# Patient Record
Sex: Female | Born: 1980 | Race: White | Hispanic: No | Marital: Married | Smoking: Never smoker
Health system: Southern US, Community
[De-identification: ages and names within clinical notes are randomized; demographics above are authoritative.]

## PROBLEM LIST (undated history)

## (undated) ENCOUNTER — Inpatient Hospital Stay (HOSPITAL_COMMUNITY): Payer: Self-pay

## (undated) DIAGNOSIS — O139 Gestational [pregnancy-induced] hypertension without significant proteinuria, unspecified trimester: Secondary | ICD-10-CM

## (undated) DIAGNOSIS — D649 Anemia, unspecified: Secondary | ICD-10-CM

## (undated) DIAGNOSIS — R87619 Unspecified abnormal cytological findings in specimens from cervix uteri: Secondary | ICD-10-CM

## (undated) DIAGNOSIS — IMO0002 Reserved for concepts with insufficient information to code with codable children: Secondary | ICD-10-CM

## (undated) HISTORY — DX: Anemia, unspecified: D64.9

## (undated) HISTORY — DX: Unspecified abnormal cytological findings in specimens from cervix uteri: R87.619

## (undated) HISTORY — DX: Reserved for concepts with insufficient information to code with codable children: IMO0002

## (undated) HISTORY — PX: LEEP: SHX91

---

## 2010-04-13 ENCOUNTER — Inpatient Hospital Stay (HOSPITAL_COMMUNITY): Admission: AD | Admit: 2010-04-13 | Discharge: 2010-04-13 | Payer: Self-pay | Admitting: Obstetrics and Gynecology

## 2010-04-18 ENCOUNTER — Inpatient Hospital Stay (HOSPITAL_COMMUNITY): Admission: AD | Admit: 2010-04-18 | Discharge: 2010-04-20 | Payer: Self-pay | Admitting: Obstetrics and Gynecology

## 2010-04-19 ENCOUNTER — Encounter (INDEPENDENT_AMBULATORY_CARE_PROVIDER_SITE_OTHER): Payer: Self-pay | Admitting: Obstetrics and Gynecology

## 2010-08-21 LAB — LACTATE DEHYDROGENASE
LDH: 150 U/L (ref 94–250)
LDH: 158 U/L (ref 94–250)

## 2010-08-21 LAB — CBC
HCT: 31.9 % — ABNORMAL LOW (ref 36.0–46.0)
HCT: 32 % — ABNORMAL LOW (ref 36.0–46.0)
Hemoglobin: 10.9 g/dL — ABNORMAL LOW (ref 12.0–15.0)
MCH: 29.9 pg (ref 26.0–34.0)
MCH: 29.9 pg (ref 26.0–34.0)
MCH: 30 pg (ref 26.0–34.0)
MCHC: 33.8 g/dL (ref 30.0–36.0)
MCHC: 33.9 g/dL (ref 30.0–36.0)
MCHC: 34.1 g/dL (ref 30.0–36.0)
MCV: 88.1 fL (ref 78.0–100.0)
MCV: 88.8 fL (ref 78.0–100.0)
Platelets: 215 10*3/uL (ref 150–400)
Platelets: 281 10*3/uL (ref 150–400)
Platelets: 282 10*3/uL (ref 150–400)
Platelets: 293 10*3/uL (ref 150–400)
RBC: 2.58 MIL/uL — ABNORMAL LOW (ref 3.87–5.11)
RBC: 3.64 MIL/uL — ABNORMAL LOW (ref 3.87–5.11)
RDW: 12.1 % (ref 11.5–15.5)
RDW: 12.2 % (ref 11.5–15.5)
RDW: 12.7 % (ref 11.5–15.5)
WBC: 10.5 10*3/uL (ref 4.0–10.5)
WBC: 8.8 10*3/uL (ref 4.0–10.5)

## 2010-08-21 LAB — COMPREHENSIVE METABOLIC PANEL
ALT: 25 U/L (ref 0–35)
AST: 32 U/L (ref 0–37)
AST: 39 U/L — ABNORMAL HIGH (ref 0–37)
Albumin: 2.3 g/dL — ABNORMAL LOW (ref 3.5–5.2)
Albumin: 2.8 g/dL — ABNORMAL LOW (ref 3.5–5.2)
Alkaline Phosphatase: 193 U/L — ABNORMAL HIGH (ref 39–117)
BUN: 8 mg/dL (ref 6–23)
CO2: 20 mEq/L (ref 19–32)
Calcium: 9 mg/dL (ref 8.4–10.5)
Chloride: 106 mEq/L (ref 96–112)
Chloride: 106 mEq/L (ref 96–112)
Creatinine, Ser: 0.61 mg/dL (ref 0.4–1.2)
Creatinine, Ser: 0.68 mg/dL (ref 0.4–1.2)
GFR calc Af Amer: >60 mL/min (ref >60)
GFR calc Af Amer: >60 mL/min (ref >60)
GFR calc non Af Amer: >60 mL/min (ref >60)
Glucose, Bld: 68 mg/dL — ABNORMAL LOW (ref 70–99)
Glucose, Bld: 74 mg/dL (ref 70–99)
Potassium: 3.7 mEq/L (ref 3.5–5.1)
Sodium: 137 mEq/L (ref 135–145)
Sodium: 137 mEq/L (ref 135–145)
Total Bilirubin: 0.2 mg/dL — ABNORMAL LOW (ref 0.3–1.2)
Total Bilirubin: 0.3 mg/dL (ref 0.3–1.2)
Total Protein: 6.9 g/dL (ref 6.0–8.3)

## 2010-08-21 LAB — URINALYSIS, ROUTINE W REFLEX MICROSCOPIC
Bilirubin Urine: NEGATIVE
Ketones, ur: NEGATIVE mg/dL
Nitrite: NEGATIVE
Protein, ur: NEGATIVE mg/dL
pH: 6.5 (ref 5.0–8.0)

## 2010-08-21 LAB — URINALYSIS, DIPSTICK ONLY
Glucose, UA: NEGATIVE mg/dL
Ketones, ur: >80 mg/dL — AB
Nitrite: NEGATIVE
Specific Gravity, Urine: 1.025 (ref 1.005–1.030)
pH: 6.5 (ref 5.0–8.0)

## 2010-08-21 LAB — URINE MICROSCOPIC-ADD ON

## 2010-08-21 LAB — RPR: RPR Ser Ql: NONREACTIVE

## 2010-08-21 LAB — PROTEIN / CREATININE RATIO, URINE
Creatinine, Urine: 77.2 mg/dL
Protein Creatinine Ratio: 0.45 — ABNORMAL HIGH (ref 0.00–0.15)

## 2010-08-21 LAB — URIC ACID: Uric Acid, Serum: 6.6 mg/dL (ref 2.4–7.0)

## 2010-10-30 ENCOUNTER — Other Ambulatory Visit: Payer: Self-pay | Admitting: Obstetrics and Gynecology

## 2012-06-10 NOTE — L&D Delivery Note (Signed)
Patient was C/C/+2 and pushed for 5 minutes with epidural, and delivered moments prior to my arrival.   NSVD female infant, Apgars 9/9, weight pending.   The patient had a 1st lacerations repaired with 3-0 vicryl. Fundus was firm. EBL was expected. Placenta was delivered intact. Vagina was clear.  Baby was vigorous to bedside.  Philip Aspen

## 2012-06-26 LAB — OB RESULTS CONSOLE ABO/RH: RH Type: POSITIVE

## 2012-06-26 LAB — OB RESULTS CONSOLE ANTIBODY SCREEN: Antibody Screen: NEGATIVE

## 2012-06-26 LAB — OB RESULTS CONSOLE RPR: RPR: NONREACTIVE

## 2012-06-26 LAB — OB RESULTS CONSOLE HEPATITIS B SURFACE ANTIGEN: Hepatitis B Surface Ag: NEGATIVE

## 2012-12-22 ENCOUNTER — Inpatient Hospital Stay (HOSPITAL_COMMUNITY)
Admission: AD | Admit: 2012-12-22 | Discharge: 2012-12-22 | Disposition: A | Discharge status: Home / Self Care | Payer: BC Managed Care – PPO | Source: Ambulatory Visit | Attending: Obstetrics and Gynecology | Admitting: Obstetrics and Gynecology

## 2012-12-22 ENCOUNTER — Encounter (HOSPITAL_COMMUNITY): Payer: Self-pay

## 2012-12-22 DIAGNOSIS — O4700 False labor before 37 completed weeks of gestation, unspecified trimester: Secondary | ICD-10-CM | POA: Insufficient documentation

## 2012-12-22 DIAGNOSIS — O139 Gestational [pregnancy-induced] hypertension without significant proteinuria, unspecified trimester: Secondary | ICD-10-CM | POA: Insufficient documentation

## 2012-12-22 HISTORY — DX: Gestational (pregnancy-induced) hypertension without significant proteinuria, unspecified trimester: O13.9

## 2012-12-22 LAB — URINALYSIS, ROUTINE W REFLEX MICROSCOPIC
Ketones, ur: NEGATIVE mg/dL
Nitrite: NEGATIVE
Protein, ur: NEGATIVE mg/dL

## 2012-12-22 LAB — CBC
HCT: 28.7 % — ABNORMAL LOW (ref 36.0–46.0)
MCV: 84.2 fL (ref 78.0–100.0)
RBC: 3.41 MIL/uL — ABNORMAL LOW (ref 3.87–5.11)
RDW: 13.4 % (ref 11.5–15.5)
WBC: 8.6 10*3/uL (ref 4.0–10.5)

## 2012-12-22 LAB — COMPREHENSIVE METABOLIC PANEL
BUN: 6 mg/dL (ref 6–23)
CO2: 22 mEq/L (ref 19–32)
Chloride: 102 mEq/L (ref 96–112)
Creatinine, Ser: 0.6 mg/dL (ref 0.50–1.10)
GFR calc Af Amer: >90 mL/min (ref >90)
GFR calc non Af Amer: >90 mL/min (ref >90)
Total Bilirubin: 0.2 mg/dL — ABNORMAL LOW (ref 0.3–1.2)

## 2012-12-22 LAB — URIC ACID: Uric Acid, Serum: 4.9 mg/dL (ref 2.4–7.0)

## 2012-12-22 NOTE — MAU Note (Signed)
Patient states she was sent from the office after a regular visit to evaluate for elevated blood pressure. Patient states she has started to have a headache, has spots before eyes and swelling in the feet and lower legs. Nausea, no vomiting. Denies contractions but does have occasional sharp abdominal pain, no bleeding or leaking and reports good fetal movement.

## 2012-12-22 NOTE — MAU Note (Signed)
Pt states prior hx of pre-eclampsia, bp 140/90's in office. Has had intermittent spots before eyes, none present now. Denies dizziness or blurred vision, also no ruq pain.

## 2012-12-22 NOTE — MAU Provider Note (Signed)
S: 32 y.o. [id number] @[redacted]w[redacted]d  presents to MAU from office for evaluation of elevated BP.  Pt reports intermittent h/a in last 2 weeks, rates current h/a 2/10 on pain scale.  She also reports seeing visual spots intermittently, but denies this symptom currently. She has hx of preeclampsia in previous pregnancy with IOL and late preterm delivery for this.  She reports good fetal movement, denies LOF, vaginal bleeding, vaginal itching/burning, urinary symptoms, dizziness, n/v, or fever/chills.    O: BP 127/86  Pulse 97  Temp(Src) 98.1 F (36.7 C) (Oral)  Resp 16  Ht 5\' 3"  (1.6 m)  Wt 84.188 kg (185 lb 9.6 oz)  BMI 32.89 kg/m2  SpO2 99%  Results for orders placed during the hospital encounter of 12/22/12 (from the past 24 hour(s))  URINALYSIS, ROUTINE W REFLEX MICROSCOPIC     Status: Abnormal   Collection Time    12/22/12 10:40 AM      Result Value Range   Color, Urine YELLOW  YELLOW   APPearance CLEAR  CLEAR   Specific Gravity, Urine 1.020  1.005 - 1.030   pH 6.5  5.0 - 8.0   Glucose, UA NEGATIVE  NEGATIVE mg/dL   Hgb urine dipstick NEGATIVE  NEGATIVE   Bilirubin Urine NEGATIVE  NEGATIVE   Ketones, ur NEGATIVE  NEGATIVE mg/dL   Protein, ur NEGATIVE  NEGATIVE mg/dL   Urobilinogen, UA 0.2  0.0 - 1.0 mg/dL   Nitrite NEGATIVE  NEGATIVE   Leukocytes, UA TRACE (*) NEGATIVE  URINE MICROSCOPIC-ADD ON     Status: Abnormal   Collection Time    12/22/12 10:40 AM      Result Value Range   Squamous Epithelial / LPF FEW (*) RARE   WBC, UA 0-2  <3 WBC/hpf  CBC     Status: Abnormal   Collection Time    12/22/12 11:25 AM      Result Value Range   WBC 8.6  4.0 - 10.5 K/uL   RBC 3.41 (*) 3.87 - 5.11 MIL/uL   Hemoglobin 9.5 (*) 12.0 - 15.0 g/dL   HCT 45.4 (*) 09.8 - 11.9 %   MCV 84.2  78.0 - 100.0 fL   MCH 27.9  26.0 - 34.0 pg   MCHC 33.1  30.0 - 36.0 g/dL   RDW 14.7  82.9 - 56.2 %   Platelets 322  150 - 400 K/uL  COMPREHENSIVE METABOLIC PANEL     Status: Abnormal   Collection Time   12/22/12 11:25 AM      Result Value Range   Sodium 136  135 - 145 mEq/L   Potassium 3.5  3.5 - 5.1 mEq/L   Chloride 102  96 - 112 mEq/L   CO2 22  19 - 32 mEq/L   Glucose, Bld 94  70 - 99 mg/dL   BUN 6  6 - 23 mg/dL   Creatinine, Ser 1.30  0.50 - 1.10 mg/dL   Calcium 9.0  8.4 - 86.5 mg/dL   Total Protein 6.3  6.0 - 8.3 g/dL   Albumin 2.5 (*) 3.5 - 5.2 g/dL   AST 14  0 - 37 U/L   ALT 8  0 - 35 U/L   Alkaline Phosphatase 107  39 - 117 U/L   Total Bilirubin 0.2 (*) 0.3 - 1.2 mg/dL   GFR calc non Af Amer >90  >90 mL/min   GFR calc Af Amer >90  >90 mL/min  URIC ACID     Status: None   Collection  Time    12/22/12 11:25 AM      Result Value Range   Uric Acid, Serum 4.9  2.4 - 7.0 mg/dL   FHR 478 with moderate variability, accels present, no decels Occasional contractions on toco that palpate mild  A: Gestational HTN vs preeclampsia  P: RN to call Dr Dareen Piano with lab results  Sharen Counter Certified Nurse-Midwife

## 2012-12-29 ENCOUNTER — Inpatient Hospital Stay (HOSPITAL_COMMUNITY)
Admission: AD | Admit: 2012-12-29 | Discharge: 2012-12-29 | Disposition: A | Discharge status: Home / Self Care | Payer: BC Managed Care – PPO | Source: Ambulatory Visit | Attending: Obstetrics & Gynecology | Admitting: Obstetrics & Gynecology

## 2012-12-29 ENCOUNTER — Encounter (HOSPITAL_COMMUNITY): Payer: Self-pay | Admitting: *Deleted

## 2012-12-29 DIAGNOSIS — O139 Gestational [pregnancy-induced] hypertension without significant proteinuria, unspecified trimester: Secondary | ICD-10-CM | POA: Insufficient documentation

## 2012-12-29 LAB — COMPREHENSIVE METABOLIC PANEL
AST: 13 U/L (ref 0–37)
BUN: 7 mg/dL (ref 6–23)
CO2: 21 mEq/L (ref 19–32)
Calcium: 9 mg/dL (ref 8.4–10.5)
Creatinine, Ser: 0.62 mg/dL (ref 0.50–1.10)
GFR calc Af Amer: >90 mL/min (ref >90)
GFR calc non Af Amer: >90 mL/min (ref >90)
Glucose, Bld: 96 mg/dL (ref 70–99)

## 2012-12-29 LAB — URIC ACID: Uric Acid, Serum: 5.1 mg/dL (ref 2.4–7.0)

## 2012-12-29 LAB — URINE MICROSCOPIC-ADD ON

## 2012-12-29 LAB — URINALYSIS, ROUTINE W REFLEX MICROSCOPIC
Nitrite: NEGATIVE
Protein, ur: NEGATIVE mg/dL
Urobilinogen, UA: 0.2 mg/dL (ref 0.0–1.0)

## 2012-12-29 LAB — CBC
MCH: 27.5 pg (ref 26.0–34.0)
MCV: 83.7 fL (ref 78.0–100.0)
Platelets: 309 10*3/uL (ref 150–400)
RBC: 3.49 MIL/uL — ABNORMAL LOW (ref 3.87–5.11)

## 2012-12-29 NOTE — Progress Notes (Signed)
Written and verbal d/c instructions given and understanding voiced. 

## 2012-12-29 NOTE — MAU Note (Signed)
Sent from office for labs and NST due to elevated B/Ps. Hx PIH with first preg.

## 2012-12-29 NOTE — MAU Provider Note (Signed)
S: 32 y.o. [id number] @[redacted]w[redacted]d  presents to MAU from the office for elevated BP.  She reports good fetal movement, and denies h/a, visual disturbances, epigastric pain, cramping/contractions, n/v, or fever/chills.  O: BP 126/88  Pulse 87  Temp(Src) 98.8 F (37.1 C)  Resp 20  Ht 5\' 3"  (1.6 m)  Wt 84.732 kg (186 lb 12.8 oz)  BMI 33.1 kg/m2  SpO2 99%  Patient Vitals for the past 24 hrs:  BP Temp Pulse Resp SpO2 Height Weight  12/29/12 1201 126/88 mmHg - 87 - - - -  12/29/12 1146 131/90 mmHg - 90 - - - -  12/29/12 1131 131/96 mmHg - 91 - - - -  12/29/12 1116 137/96 mmHg - 101 - - - -  12/29/12 1110 - - 102 - - - -  12/29/12 1109 135/94 mmHg - 98 - - - -  12/29/12 1108 135/94 mmHg 98.8 F (37.1 C) 98 20 99 % - -  12/29/12 1059 - - - - - 5\' 3"  (1.6 m) 84.732 kg (186 lb 12.8 oz)   Results for orders placed during the hospital encounter of 12/29/12 (from the past 24 hour(s))  URINALYSIS, ROUTINE W REFLEX MICROSCOPIC     Status: Abnormal   Collection Time    12/29/12 10:55 AM      Result Value Range   Color, Urine YELLOW  YELLOW   APPearance CLEAR  CLEAR   Specific Gravity, Urine 1.015  1.005 - 1.030   pH 6.0  5.0 - 8.0   Glucose, UA NEGATIVE  NEGATIVE mg/dL   Hgb urine dipstick NEGATIVE  NEGATIVE   Bilirubin Urine NEGATIVE  NEGATIVE   Ketones, ur NEGATIVE  NEGATIVE mg/dL   Protein, ur NEGATIVE  NEGATIVE mg/dL   Urobilinogen, UA 0.2  0.0 - 1.0 mg/dL   Nitrite NEGATIVE  NEGATIVE   Leukocytes, UA TRACE (*) NEGATIVE  URINE MICROSCOPIC-ADD ON     Status: None   Collection Time    12/29/12 10:55 AM      Result Value Range   Squamous Epithelial / LPF RARE  RARE   WBC, UA 0-2  <3 WBC/hpf   Bacteria, UA RARE  RARE  CBC     Status: Abnormal   Collection Time    12/29/12 11:15 AM      Result Value Range   WBC 8.6  4.0 - 10.5 K/uL   RBC 3.49 (*) 3.87 - 5.11 MIL/uL   Hemoglobin 9.6 (*) 12.0 - 15.0 g/dL   HCT 45.4 (*) 09.8 - 11.9 %   MCV 83.7  78.0 - 100.0 fL   MCH 27.5  26.0 - 34.0 pg    MCHC 32.9  30.0 - 36.0 g/dL   RDW 14.7  82.9 - 56.2 %   Platelets 309  150 - 400 K/uL  COMPREHENSIVE METABOLIC PANEL     Status: Abnormal   Collection Time    12/29/12 11:15 AM      Result Value Range   Sodium 134 (*) 135 - 145 mEq/L   Potassium 3.6  3.5 - 5.1 mEq/L   Chloride 102  96 - 112 mEq/L   CO2 21  19 - 32 mEq/L   Glucose, Bld 96  70 - 99 mg/dL   BUN 7  6 - 23 mg/dL   Creatinine, Ser 1.30  0.50 - 1.10 mg/dL   Calcium 9.0  8.4 - 86.5 mg/dL   Total Protein 6.5  6.0 - 8.3 g/dL   Albumin 2.6 (*) 3.5 -  5.2 g/dL   AST 13  0 - 37 U/L   ALT 7  0 - 35 U/L   Alkaline Phosphatase 118 (*) 39 - 117 U/L   Total Bilirubin 0.2 (*) 0.3 - 1.2 mg/dL   GFR calc non Af Amer >90  >90 mL/min   GFR calc Af Amer >90  >90 mL/min  URIC ACID     Status: None   Collection Time    12/29/12 11:15 AM      Result Value Range   Uric Acid, Serum 5.1  2.4 - 7.0 mg/dL   A: Gestational HTN  P: Medical screening completed RN to call Dr Tenny Craw with results  Sharen Counter Certified Nurse-Midwife

## 2012-12-29 NOTE — MAU Provider Note (Signed)
Gestational hypertension without severe range BPs. Will have patient return to office Thursday for a BP check and NST.  Consider IOL @ 37 weeks

## 2012-12-29 NOTE — MAU Note (Signed)
Patient was sent from the office for evaluation of elevated blood pressure.

## 2012-12-29 NOTE — Progress Notes (Signed)
Dr Waynard Reeds notified of pt's lab results, assessment, B/Ps, reactive FHR strip. Pt stable for d/c home. Office will call pt for visit in office on Thurs. 7/24. Pt agrees

## 2012-12-31 ENCOUNTER — Inpatient Hospital Stay (EMERGENCY_DEPARTMENT_HOSPITAL)
Admission: AD | Admit: 2012-12-31 | Discharge: 2012-12-31 | Disposition: A | Discharge status: Home / Self Care | Payer: BC Managed Care – PPO | Source: Ambulatory Visit | Attending: Obstetrics and Gynecology | Admitting: Obstetrics and Gynecology

## 2012-12-31 ENCOUNTER — Encounter (HOSPITAL_COMMUNITY): Payer: Self-pay | Admitting: *Deleted

## 2012-12-31 DIAGNOSIS — O139 Gestational [pregnancy-induced] hypertension without significant proteinuria, unspecified trimester: Secondary | ICD-10-CM

## 2012-12-31 DIAGNOSIS — O133 Gestational [pregnancy-induced] hypertension without significant proteinuria, third trimester: Secondary | ICD-10-CM

## 2012-12-31 LAB — OB RESULTS CONSOLE GBS: GBS: POSITIVE

## 2012-12-31 LAB — COMPREHENSIVE METABOLIC PANEL
AST: 14 U/L (ref 0–37)
Albumin: 2.5 g/dL — ABNORMAL LOW (ref 3.5–5.2)
BUN: 8 mg/dL (ref 6–23)
CO2: 20 mEq/L (ref 19–32)
Calcium: 9.1 mg/dL (ref 8.4–10.5)
Creatinine, Ser: 0.62 mg/dL (ref 0.50–1.10)
GFR calc non Af Amer: >90 mL/min (ref >90)

## 2012-12-31 LAB — LACTATE DEHYDROGENASE: LDH: 176 U/L (ref 94–250)

## 2012-12-31 LAB — CBC
HCT: 28.4 % — ABNORMAL LOW (ref 36.0–46.0)
MCH: 27.2 pg (ref 26.0–34.0)
MCV: 83 fL (ref 78.0–100.0)
Platelets: 337 10*3/uL (ref 150–400)
RDW: 13.6 % (ref 11.5–15.5)

## 2012-12-31 LAB — URINALYSIS, ROUTINE W REFLEX MICROSCOPIC
Bilirubin Urine: NEGATIVE
Ketones, ur: NEGATIVE mg/dL
Nitrite: NEGATIVE
Urobilinogen, UA: 0.2 mg/dL (ref 0.0–1.0)
pH: 6.5 (ref 5.0–8.0)

## 2012-12-31 LAB — URIC ACID: Uric Acid, Serum: 4.7 mg/dL (ref 2.4–7.0)

## 2012-12-31 NOTE — MAU Provider Note (Signed)
agree

## 2012-12-31 NOTE — MAU Note (Signed)
Pt sent from office for PIH eval 

## 2012-12-31 NOTE — MAU Provider Note (Addendum)
Chief Complaint:  Hypertension   None     HPI: Maureen Melton is a 32 y.o. G2P0101 at 84w1dwho presents to maternity admissions with elevated BP in the office. She was seen in MAU 2 days ago with the same.  She also has an increase in swelling of her feet and ankles and a mild h/a starting this morning rated 2/10.  She has not taken anything for pain.  She denies visual disturbances or epigastric pain.  She reports good fetal movement, denies LOF, vaginal bleeding, vaginal itching/burning, urinary symptoms, dizziness, n/v, or fever/chills.     Past Medical History: Past Medical History  Diagnosis Date  . Pregnancy induced hypertension   . Preterm labor     Past obstetric history: OB History   Grav Para Term Preterm Abortions TAB SAB Ect Mult Living   2 1  1      1      # Outc Date GA Lbr Len/2nd Wgt Sex Del Anes PTL Lv   1 PRE            2 CUR               Past Surgical History: Past Surgical History  Procedure Laterality Date  . Leep      Family History: Family History  Problem Relation Age of Onset  . Diabetes Paternal Grandmother   . Diabetes Paternal Grandfather     Social History: History  Substance Use Topics  . Smoking status: Never Smoker   . Smokeless tobacco: Never Used  . Alcohol Use: No    Allergies:  Allergies  Allergen Reactions  . Penicillins Rash  . Zithromax (Azithromycin) Rash    Meds:  Prescriptions prior to admission  Medication Sig Dispense Refill  . acetaminophen (TYLENOL) 325 MG tablet Take 650 mg by mouth every 6 (six) hours as needed for pain.      . Prenatal Vit-Fe Fumarate-FA (PRENATAL MULTIVITAMIN) TABS Take 1 tablet by mouth daily.         ROS: Pertinent findings in history of present illness.  Physical Exam  Blood pressure 129/82, pulse 90, temperature 98.3 F (36.8 C), temperature source Oral, resp. rate 18, height 5\' 3"  (1.6 m), weight 85.276 kg (188 lb). GENERAL: Well-developed, well-nourished female in no acute  distress.  HEENT: normocephalic HEART: normal rate RESP: normal effort ABDOMEN: Soft, non-tender, gravid appropriate for gestational age EXTREMITIES: Nontender, no edema NEURO: alert and oriented   Results for orders placed during the hospital encounter of 12/31/12 (from the past 24 hour(s))  URINALYSIS, ROUTINE W REFLEX MICROSCOPIC     Status: Abnormal   Collection Time    12/31/12 11:00 AM      Result Value Range   Color, Urine YELLOW  YELLOW   APPearance CLEAR  CLEAR   Specific Gravity, Urine 1.020  1.005 - 1.030   pH 6.5  5.0 - 8.0   Glucose, UA NEGATIVE  NEGATIVE mg/dL   Hgb urine dipstick NEGATIVE  NEGATIVE   Bilirubin Urine NEGATIVE  NEGATIVE   Ketones, ur NEGATIVE  NEGATIVE mg/dL   Protein, ur NEGATIVE  NEGATIVE mg/dL   Urobilinogen, UA 0.2  0.0 - 1.0 mg/dL   Nitrite NEGATIVE  NEGATIVE   Leukocytes, UA SMALL (*) NEGATIVE  URINE MICROSCOPIC-ADD ON     Status: Abnormal   Collection Time    12/31/12 11:00 AM      Result Value Range   Squamous Epithelial / LPF FEW (*) RARE   WBC,  UA 3-6  <3 WBC/hpf  CBC     Status: Abnormal   Collection Time    12/31/12 12:24 PM      Result Value Range   WBC 9.3  4.0 - 10.5 K/uL   RBC 3.42 (*) 3.87 - 5.11 MIL/uL   Hemoglobin 9.3 (*) 12.0 - 15.0 g/dL   HCT 16.1 (*) 09.6 - 04.5 %   MCV 83.0  78.0 - 100.0 fL   MCH 27.2  26.0 - 34.0 pg   MCHC 32.7  30.0 - 36.0 g/dL   RDW 40.9  81.1 - 91.4 %   Platelets 337  150 - 400 K/uL  COMPREHENSIVE METABOLIC PANEL     Status: Abnormal   Collection Time    12/31/12 12:24 PM      Result Value Range   Sodium 134 (*) 135 - 145 mEq/L   Potassium 4.0  3.5 - 5.1 mEq/L   Chloride 102  96 - 112 mEq/L   CO2 20  19 - 32 mEq/L   Glucose, Bld 80  70 - 99 mg/dL   BUN 8  6 - 23 mg/dL   Creatinine, Ser 7.82  0.50 - 1.10 mg/dL   Calcium 9.1  8.4 - 95.6 mg/dL   Total Protein 6.2  6.0 - 8.3 g/dL   Albumin 2.5 (*) 3.5 - 5.2 g/dL   AST 14  0 - 37 U/L   ALT 7  0 - 35 U/L   Alkaline Phosphatase 115  39 - 117  U/L   Total Bilirubin 0.2 (*) 0.3 - 1.2 mg/dL   GFR calc non Af Amer >90  >90 mL/min   GFR calc Af Amer >90  >90 mL/min  URIC ACID     Status: None   Collection Time    12/31/12 12:24 PM      Result Value Range   Uric Acid, Serum 4.7  2.4 - 7.0 mg/dL  LACTATE DEHYDROGENASE     Status: None   Collection Time    12/31/12 12:24 PM      Result Value Range   LDH 176  94 - 250 U/L    FHT:  Baseline 135 , moderate variability, accelerations present, no decelerations Contractions: occasional   PIH labs pending Tylenol 650 mg PO in MAU  Report to Joseph Berkshire, PA   Sharen Counter Certified Nurse-Midwife 12/31/2012 12:14 PM  Current Blood pressure 129/82, 127/80 FMS reactive baseline 125 rare-occ contraction Labs, current BPs, and FMS reading reported to Dr. Claiborne Billings.  May discharge patient to home.  Tell patient the office will be calling her because she now meets criteria for gestational hypertension and the office will call to set up induction. Patient states her headache has resolved.  A:  Gestational hypertension  P:  PIH precautions given as well as instructions that office will call her     Report any headache, visual changes, chest pain, vaginal bleeding/loss of fluid, decreased fetal movement to MD

## 2013-01-01 ENCOUNTER — Encounter (HOSPITAL_COMMUNITY): Payer: Self-pay | Admitting: *Deleted

## 2013-01-01 ENCOUNTER — Telehealth (HOSPITAL_COMMUNITY): Payer: Self-pay | Admitting: *Deleted

## 2013-01-01 NOTE — Telephone Encounter (Signed)
Preadmission screen  

## 2013-01-01 NOTE — MAU Provider Note (Signed)
agree

## 2013-01-05 ENCOUNTER — Inpatient Hospital Stay (HOSPITAL_COMMUNITY)
Admission: RE | Admit: 2013-01-05 | Discharge: 2013-01-08 | DRG: 372 | Disposition: A | Discharge status: Home / Self Care | Payer: BC Managed Care – PPO | Source: Ambulatory Visit | Attending: Obstetrics and Gynecology | Admitting: Obstetrics and Gynecology

## 2013-01-05 ENCOUNTER — Encounter (HOSPITAL_COMMUNITY): Payer: Self-pay

## 2013-01-05 DIAGNOSIS — O99892 Other specified diseases and conditions complicating childbirth: Secondary | ICD-10-CM | POA: Diagnosis present

## 2013-01-05 DIAGNOSIS — O139 Gestational [pregnancy-induced] hypertension without significant proteinuria, unspecified trimester: Secondary | ICD-10-CM | POA: Diagnosis present

## 2013-01-05 DIAGNOSIS — Z22330 Carrier of Group B streptococcus: Secondary | ICD-10-CM

## 2013-01-05 DIAGNOSIS — Z880 Allergy status to penicillin: Secondary | ICD-10-CM

## 2013-01-05 LAB — COMPREHENSIVE METABOLIC PANEL
BUN: 9 mg/dL (ref 6–23)
CO2: 22 mEq/L (ref 19–32)
Calcium: 9.4 mg/dL (ref 8.4–10.5)
Chloride: 102 mEq/L (ref 96–112)
Creatinine, Ser: 0.51 mg/dL (ref 0.50–1.10)
GFR calc Af Amer: >90 mL/min (ref >90)
GFR calc non Af Amer: >90 mL/min (ref >90)
Glucose, Bld: 102 mg/dL — ABNORMAL HIGH (ref 70–99)
Total Bilirubin: 0.2 mg/dL — ABNORMAL LOW (ref 0.3–1.2)

## 2013-01-05 LAB — CBC
MCH: 26.6 pg (ref 26.0–34.0)
Platelets: 358 10*3/uL (ref 150–400)
RBC: 3.64 MIL/uL — ABNORMAL LOW (ref 3.87–5.11)
RDW: 13.6 % (ref 11.5–15.5)

## 2013-01-05 LAB — URIC ACID: Uric Acid, Serum: 4.7 mg/dL (ref 2.4–7.0)

## 2013-01-05 LAB — TYPE AND SCREEN: ABO/RH(D): O POS

## 2013-01-05 LAB — ABO/RH: ABO/RH(D): O POS

## 2013-01-05 LAB — LACTATE DEHYDROGENASE: LDH: 187 U/L (ref 94–250)

## 2013-01-05 MED ORDER — CITRIC ACID-SODIUM CITRATE 334-500 MG/5ML PO SOLN: 30.0000 mL | ORAL | Status: DC | PRN

## 2013-01-05 MED ORDER — TERBUTALINE SULFATE 1 MG/ML IJ SOLN: 0.2500 mg | Freq: Once | INTRAMUSCULAR | Status: AC | PRN

## 2013-01-05 MED ORDER — IBUPROFEN 600 MG PO TABS: 600.0000 mg | ORAL_TABLET | Freq: Four times a day (QID) | ORAL | Status: DC | PRN

## 2013-01-05 MED ORDER — OXYCODONE-ACETAMINOPHEN 5-325 MG PO TABS: 1.0000 | ORAL_TABLET | ORAL | Status: DC | PRN

## 2013-01-05 MED ORDER — LACTATED RINGERS IV SOLN: 500.0000 mL | INTRAVENOUS | Status: DC | PRN

## 2013-01-05 MED ORDER — LACTATED RINGERS IV SOLN
INTRAVENOUS | Status: DC
  Administered 2013-01-05 – 2013-01-06 (×2): via INTRAVENOUS

## 2013-01-05 MED ORDER — CEFAZOLIN SODIUM-DEXTROSE 2-3 GM-% IV SOLR
2.0000 g | Freq: Once | INTRAVENOUS | Status: AC
  Administered 2013-01-05: 2 g via INTRAVENOUS
  Filled 2013-01-05: qty 50

## 2013-01-05 MED ORDER — ONDANSETRON HCL 4 MG/2ML IJ SOLN: 4.0000 mg | Freq: Four times a day (QID) | INTRAMUSCULAR | Status: DC | PRN

## 2013-01-05 MED ORDER — OXYTOCIN 40 UNITS IN LACTATED RINGERS INFUSION - SIMPLE MED
1.0000 m[IU]/min | INTRAVENOUS | Status: DC
  Administered 2013-01-05: 1 m[IU]/min via INTRAVENOUS
  Filled 2013-01-05: qty 1000

## 2013-01-05 MED ORDER — LIDOCAINE HCL (PF) 1 % IJ SOLN
30.0000 mL | INTRAMUSCULAR | Status: DC | PRN
  Filled 2013-01-05 (×3): qty 30

## 2013-01-05 MED ORDER — OXYTOCIN BOLUS FROM INFUSION
500.0000 mL | INTRAVENOUS | Status: DC
  Administered 2013-01-06: 500 mL via INTRAVENOUS

## 2013-01-05 MED ORDER — ACETAMINOPHEN 325 MG PO TABS: 650.0000 mg | ORAL_TABLET | ORAL | Status: DC | PRN

## 2013-01-05 MED ORDER — BUTORPHANOL TARTRATE 1 MG/ML IJ SOLN: 1.0000 mg | INTRAMUSCULAR | Status: DC | PRN

## 2013-01-05 MED ORDER — ZOLPIDEM TARTRATE 5 MG PO TABS: 5.0000 mg | ORAL_TABLET | Freq: Every evening | ORAL | Status: DC | PRN

## 2013-01-05 MED ORDER — CEFAZOLIN SODIUM 1-5 GM-% IV SOLN
1.0000 g | Freq: Three times a day (TID) | INTRAVENOUS | Status: DC
  Administered 2013-01-06: 1 g via INTRAVENOUS
  Filled 2013-01-05 (×2): qty 50

## 2013-01-05 MED ORDER — OXYTOCIN 40 UNITS IN LACTATED RINGERS INFUSION - SIMPLE MED: 62.5000 mL/h | INTRAVENOUS | Status: DC

## 2013-01-06 ENCOUNTER — Inpatient Hospital Stay (HOSPITAL_COMMUNITY): Payer: BC Managed Care – PPO | Admitting: Anesthesiology

## 2013-01-06 ENCOUNTER — Encounter (HOSPITAL_COMMUNITY): Payer: Self-pay

## 2013-01-06 ENCOUNTER — Encounter (HOSPITAL_COMMUNITY): Payer: Self-pay | Admitting: Anesthesiology

## 2013-01-06 LAB — CBC
HCT: 30.1 % — ABNORMAL LOW (ref 36.0–46.0)
Hemoglobin: 9.6 g/dL — ABNORMAL LOW (ref 12.0–15.0)
MCH: 26.7 pg (ref 26.0–34.0)
MCV: 83.6 fL (ref 78.0–100.0)
Platelets: 299 10*3/uL (ref 150–400)
RBC: 3.6 MIL/uL — ABNORMAL LOW (ref 3.87–5.11)
WBC: 11.9 10*3/uL — ABNORMAL HIGH (ref 4.0–10.5)

## 2013-01-06 LAB — RPR: RPR Ser Ql: NONREACTIVE

## 2013-01-06 MED ORDER — ZOLPIDEM TARTRATE 5 MG PO TABS: 5.0000 mg | ORAL_TABLET | Freq: Every evening | ORAL | Status: DC | PRN

## 2013-01-06 MED ORDER — FENTANYL 2.5 MCG/ML BUPIVACAINE 1/10 % EPIDURAL INFUSION (WH - ANES)
14.0000 mL/h | INTRAMUSCULAR | Status: DC | PRN
  Administered 2013-01-06: 14 mL/h via EPIDURAL
  Filled 2013-01-06: qty 125

## 2013-01-06 MED ORDER — SIMETHICONE 80 MG PO CHEW: 80.0000 mg | CHEWABLE_TABLET | ORAL | Status: DC | PRN

## 2013-01-06 MED ORDER — OXYCODONE-ACETAMINOPHEN 5-325 MG PO TABS: 1.0000 | ORAL_TABLET | ORAL | Status: DC | PRN

## 2013-01-06 MED ORDER — BENZOCAINE-MENTHOL 20-0.5 % EX AERO
1.0000 "application " | INHALATION_SPRAY | CUTANEOUS | Status: DC | PRN
  Administered 2013-01-06: 1 via TOPICAL
  Filled 2013-01-06: qty 56

## 2013-01-06 MED ORDER — TETANUS-DIPHTH-ACELL PERTUSSIS 5-2.5-18.5 LF-MCG/0.5 IM SUSP
0.5000 mL | Freq: Once | INTRAMUSCULAR | Status: AC
  Administered 2013-01-07: 0.5 mL via INTRAMUSCULAR
  Filled 2013-01-06 (×2): qty 0.5

## 2013-01-06 MED ORDER — OXYTOCIN 40 UNITS IN LACTATED RINGERS INFUSION - SIMPLE MED: 1.0000 m[IU]/min | INTRAVENOUS | Status: DC

## 2013-01-06 MED ORDER — LIDOCAINE HCL (PF) 1 % IJ SOLN
INTRAMUSCULAR | Status: DC | PRN
  Administered 2013-01-06 (×2): 5 mL

## 2013-01-06 MED ORDER — LACTATED RINGERS IV SOLN
500.0000 mL | Freq: Once | INTRAVENOUS | Status: AC
Start: 2013-01-06 — End: 2013-01-06
  Administered 2013-01-06: 500 mL via INTRAVENOUS

## 2013-01-06 MED ORDER — ONDANSETRON HCL 4 MG PO TABS: 4.0000 mg | ORAL_TABLET | ORAL | Status: DC | PRN

## 2013-01-06 MED ORDER — DIPHENHYDRAMINE HCL 25 MG PO CAPS: 25.0000 mg | ORAL_CAPSULE | Freq: Four times a day (QID) | ORAL | Status: DC | PRN

## 2013-01-06 MED ORDER — EPHEDRINE 5 MG/ML INJ
10.0000 mg | INTRAVENOUS | Status: DC | PRN
  Filled 2013-01-06: qty 2

## 2013-01-06 MED ORDER — PHENYLEPHRINE 40 MCG/ML (10ML) SYRINGE FOR IV PUSH (FOR BLOOD PRESSURE SUPPORT)
80.0000 ug | PREFILLED_SYRINGE | INTRAVENOUS | Status: DC | PRN
  Filled 2013-01-06: qty 5
  Filled 2013-01-06: qty 2

## 2013-01-06 MED ORDER — PHENYLEPHRINE 40 MCG/ML (10ML) SYRINGE FOR IV PUSH (FOR BLOOD PRESSURE SUPPORT)
80.0000 ug | PREFILLED_SYRINGE | INTRAVENOUS | Status: DC | PRN
  Filled 2013-01-06: qty 2

## 2013-01-06 MED ORDER — ONDANSETRON HCL 4 MG/2ML IJ SOLN: 4.0000 mg | INTRAMUSCULAR | Status: DC | PRN

## 2013-01-06 MED ORDER — WITCH HAZEL-GLYCERIN EX PADS: 1.0000 "application " | MEDICATED_PAD | CUTANEOUS | Status: DC | PRN

## 2013-01-06 MED ORDER — DIPHENHYDRAMINE HCL 50 MG/ML IJ SOLN: 12.5000 mg | INTRAMUSCULAR | Status: DC | PRN

## 2013-01-06 MED ORDER — PRENATAL MULTIVITAMIN CH
1.0000 | ORAL_TABLET | Freq: Every day | ORAL | Status: DC
  Administered 2013-01-06 – 2013-01-07 (×2): 1 via ORAL
  Filled 2013-01-06 (×2): qty 1

## 2013-01-06 MED ORDER — IBUPROFEN 600 MG PO TABS
600.0000 mg | ORAL_TABLET | Freq: Four times a day (QID) | ORAL | Status: DC
  Administered 2013-01-06 – 2013-01-08 (×7): 600 mg via ORAL
  Filled 2013-01-06 (×7): qty 1

## 2013-01-06 MED ORDER — EPHEDRINE 5 MG/ML INJ
10.0000 mg | INTRAVENOUS | Status: DC | PRN
  Filled 2013-01-06: qty 4
  Filled 2013-01-06: qty 2

## 2013-01-06 MED ORDER — SENNOSIDES-DOCUSATE SODIUM 8.6-50 MG PO TABS
2.0000 | ORAL_TABLET | Freq: Every day | ORAL | Status: DC
  Administered 2013-01-06: 2 via ORAL

## 2013-01-06 MED ORDER — LANOLIN HYDROUS EX OINT: TOPICAL_OINTMENT | CUTANEOUS | Status: DC | PRN

## 2013-01-06 MED ORDER — DIBUCAINE 1 % RE OINT: 1.0000 "application " | TOPICAL_OINTMENT | RECTAL | Status: DC | PRN

## 2013-01-06 NOTE — Anesthesia Postprocedure Evaluation (Signed)
  Anesthesia Post-op Note  Patient: Maureen Melton  Procedure(s) Performed: * No procedures listed *  Patient Location: Mother/Baby  Anesthesia Type:Epidural  Level of Consciousness: awake, alert  and oriented  Airway and Oxygen Therapy: Patient Spontanous Breathing  Post-op Pain: none  Post-op Assessment: Post-op Vital signs reviewed and Patient's Cardiovascular Status Stable  Post-op Vital Signs: Reviewed and stable  Complications: No apparent anesthesia complications

## 2013-01-06 NOTE — Anesthesia Procedure Notes (Signed)
Epidural Patient location during procedure: OB Start time: 01/06/2013 2:07 AM  Staffing Anesthesiologist: Angus Seller., Harrell Gave. Performed by: anesthesiologist   Preanesthetic Checklist Completed: patient identified, site marked, surgical consent, pre-op evaluation, timeout performed, IV checked, risks and benefits discussed and monitors and equipment checked  Epidural Patient position: sitting Prep: site prepped and draped and DuraPrep Patient monitoring: continuous pulse ox and blood pressure Approach: midline Injection technique: LOR air and LOR saline  Needle:  Needle type: Tuohy  Needle gauge: 17 G Needle length: 9 cm and 9 Needle insertion depth: 5 cm cm Catheter type: closed end flexible Catheter size: 19 Gauge Catheter at skin depth: 10 cm Test dose: negative  Assessment Events: blood not aspirated, injection not painful, no injection resistance, negative IV test and no paresthesia  Additional Notes Patient identified.  Risk benefits discussed including failed block, incomplete pain control, headache, nerve damage, paralysis, blood pressure changes, nausea, vomiting, reactions to medication both toxic or allergic, and postpartum back pain.  Patient expressed understanding and wished to proceed.  All questions were answered.  Sterile technique used throughout procedure and epidural site dressed with sterile barrier dressing. No paresthesia or other complications noted.The patient did not experience any signs of intravascular injection such as tinnitus or metallic taste in mouth nor signs of intrathecal spread such as rapid motor block. Please see nursing notes for vital signs.

## 2013-01-06 NOTE — Anesthesia Preprocedure Evaluation (Signed)
Anesthesia Evaluation  Patient identified by MRN, date of birth, ID band Patient awake    Reviewed: Allergy & Precautions, H&P , Patient's Chart, lab work & pertinent test results  Airway Mallampati: II TM Distance: >3 FB Neck ROM: full    Dental no notable dental hx.    Pulmonary neg pulmonary ROS,  breath sounds clear to auscultation  Pulmonary exam normal       Cardiovascular hypertension, negative cardio ROS  Rhythm:regular Rate:Normal     Neuro/Psych negative neurological ROS  negative psych ROS   GI/Hepatic negative GI ROS, Neg liver ROS,   Endo/Other  negative endocrine ROS  Renal/GU negative Renal ROS     Musculoskeletal   Abdominal   Peds  Hematology negative hematology ROS (+)   Anesthesia Other Findings   Reproductive/Obstetrics (+) Pregnancy                           Anesthesia Physical Anesthesia Plan  ASA: III  Anesthesia Plan: Epidural   Post-op Pain Management:    Induction:   Airway Management Planned:   Additional Equipment:   Intra-op Plan:   Post-operative Plan:   Informed Consent: I have reviewed the patients History and Physical, chart, labs and discussed the procedure including the risks, benefits and alternatives for the proposed anesthesia with the patient or authorized representative who has indicated his/her understanding and acceptance.     Plan Discussed with:   Anesthesia Plan Comments:         Anesthesia Quick Evaluation

## 2013-01-06 NOTE — H&P (Signed)
Maureen Melton is a 32 y.o. female presenting for IOL for gestational hypertension  32 You G2P1011 @ 37 weeks presents for IOL for gestational hypertension. WHen her induction was initially scheduled her cervical exam was 1/50/-2, however on exam this evening her cervix was felt to be much more favorable at 2+/70 therefore the decision was made to start low dose pitocin as opposed to misoprostal.  History OB History   Grav Para Term Preterm Abortions TAB SAB Ect Mult Living   2 1 1  0      1     Past Medical History  Diagnosis Date  . Pregnancy induced hypertension   . Preterm labor   . Abnormal Pap smear    Past Surgical History  Procedure Laterality Date  . Leep     Family History: family history includes Diabetes in her paternal grandfather and paternal grandmother and Heart disease in her maternal grandfather. Social History:  reports that she has never smoked. She has never used smokeless tobacco. She reports that she does not drink alcohol or use illicit drugs.   Prenatal Transfer Tool  Maternal Diabetes: no Genetic Screening: negative Maternal Ultrasounds/Referrals: Normal Fetal Ultrasounds or other Referrals:  None Maternal Substance Abuse:  No Significant Maternal Medications:  No Significant Maternal Lab Results:  none Other Comments:  Gestational hypertension  ROS: as above  Dilation: 2 Effacement (%): 70 Station: -2 Exam by:: [email or web address] Blood pressure 152/91, pulse 74, temperature 98.6 F (37 C), temperature source Oral, resp. rate 16, height 5\' 3"  (1.6 m), weight 85.276 kg (188 lb). Exam Physical Exam  Prenatal labs: ABO, Rh: --/--/O POS, O POS (07/29 2025) Antibody: NEG (07/29 2025) Rubella: Immune (01/17 0000) RPR: Nonreactive (01/17 0000)  HBsAg: Negative (01/17 0000)  HIV: Non-reactive (01/17 0000)  GBS: Positive (07/24 0000)   CBC    Component Value Date/Time   WBC 10.2 01/05/2013 2025   RBC 3.64* 01/05/2013 2025   HGB 9.7* 01/05/2013 2025    HCT 30.1* 01/05/2013 2025   PLT 358 01/05/2013 2025   MCV 82.7 01/05/2013 2025   MCH 26.6 01/05/2013 2025   MCHC 32.2 01/05/2013 2025   RDW 13.6 01/05/2013 2025    CMP     Component Value Date/Time   NA 134* 01/05/2013 2025   K 3.7 01/05/2013 2025   CL 102 01/05/2013 2025   CO2 22 01/05/2013 2025   GLUCOSE 102* 01/05/2013 2025   BUN 9 01/05/2013 2025   CREATININE 0.51 01/05/2013 2025   CALCIUM 9.4 01/05/2013 2025   PROT 6.6 01/05/2013 2025   ALBUMIN 2.7* 01/05/2013 2025   AST 15 01/05/2013 2025   ALT 7 01/05/2013 2025   ALKPHOS 123* 01/05/2013 2025   BILITOT 0.2* 01/05/2013 2025   GFRNONAA >90 01/05/2013 2025   GFRAA >90 01/05/2013 2025      Assessment/Plan: 1) Admit 2) Low dose pitocin with AROM Pit in am 3) Pt GBS + and PCN allergic, althoug PCN only causes hives. Sensitivities not done on GBS therefore will trat with Ancef Q 8 hrs 4) Epidural on request    Maureen Melton H. 01/06/2013, 12:50 AM

## 2013-01-07 LAB — CBC
HCT: 26.3 % — ABNORMAL LOW (ref 36.0–46.0)
Hemoglobin: 8.5 g/dL — ABNORMAL LOW (ref 12.0–15.0)
MCH: 27 pg (ref 26.0–34.0)
MCHC: 32.3 g/dL (ref 30.0–36.0)
MCV: 83.5 fL (ref 78.0–100.0)
RDW: 13.9 % (ref 11.5–15.5)

## 2013-01-08 NOTE — Discharge Summary (Signed)
Obstetric Discharge Summary Reason for Admission: onset of labor Prenatal Procedures: none Intrapartum Procedures: spontaneous vaginal delivery Postpartum Procedures: none Complications-Operative and Postpartum: 1 degree perineal laceration Hemoglobin  Date Value Range Status  01/07/2013 8.5* 12.0 - 15.0 g/dL Final     HCT  Date Value Range Status  01/07/2013 26.3* 36.0 - 46.0 % Final     Discharge Diagnoses: Term Pregnancy-delivered  Discharge Information: Date: 01/08/2013 Activity: pelvic rest Diet: routine Medications: Ibuprofen Condition: stable Instructions: refer to practice specific booklet Discharge to: home Follow-up Information   Follow up with CALLAHAN, SIDNEY, DO In 4 weeks.   Contact information:   [address] [place] [address] [phone]       Newborn Data:   Aubree, Doody [[id number]]  Live born female  Birth Weight: 5 lb 8.8 oz (2517 g) APGAR: 9, [address]   Khalis, Hittle [[id number]]  Live born female  Birth Weight:  APGAR: ,   Home with mother.  Maureen Melton A 01/08/2013, 8:00 AM

## 2013-01-08 NOTE — Progress Notes (Signed)
Patient is eating, ambulating, voiding.  Pain control is good.  Filed Vitals:   01/07/13 0104 01/07/13 0614 01/07/13 1835 01/08/13 0610  BP: 128/81 130/89 122/84 141/86  Pulse: 85 83 98 80  Temp: 98 F (36.7 C) 97.8 F (36.6 C) 97.4 F (36.3 C) 98.1 F (36.7 C)  TempSrc: Oral Oral Oral Oral  Resp: 20 18 18 16   Height:      Weight:      SpO2:        Fundus firm Perineum without swelling.  Lab Results  Component Value Date   WBC 9.4 01/07/2013   HGB 8.5* 01/07/2013   HCT 26.3* 01/07/2013   MCV 83.5 01/07/2013   PLT 243 01/07/2013    --/--/O POS, O POS (07/29 2025)/RI  A/P Post partum day 2.  Routine care.  Expect d/c today.    Kionna Brier A

## 2013-01-12 NOTE — Progress Notes (Signed)
Post discharge chart review completed.  

## 2013-04-15 ENCOUNTER — Other Ambulatory Visit: Payer: Self-pay

## 2014-04-11 ENCOUNTER — Encounter (HOSPITAL_COMMUNITY): Payer: Self-pay

## 2016-06-10 NOTE — L&D Delivery Note (Signed)
Delivery Note At 10:43 PM a viable and healthy female was delivered via Vaginal, Spontaneous Delivery (Presentation: Right Occiput; Anterior ).  APGAR: 9, 9; weight  pending.   Placenta status: spontaneous, intact.  Cord: 3V with loose nuchal x 2.   Anesthesia:  Epidural  Episiotomy: None Lacerations:  None Suture Repair: NA Est. Blood Loss (mL): 250  Mom to postpartum.  Baby to Couplet care / Skin to Skin.  Gwendola Hornaday H. 01/12/2017, 11:02 PM

## 2016-07-25 LAB — OB RESULTS CONSOLE ANTIBODY SCREEN: Antibody Screen: NEGATIVE

## 2016-07-25 LAB — OB RESULTS CONSOLE ABO/RH: RH Type: POSITIVE

## 2016-07-25 LAB — OB RESULTS CONSOLE HIV ANTIBODY (ROUTINE TESTING): HIV: NONREACTIVE

## 2016-07-25 LAB — OB RESULTS CONSOLE GC/CHLAMYDIA
CHLAMYDIA, DNA PROBE: NEGATIVE
Gonorrhea: NEGATIVE

## 2016-07-25 LAB — OB RESULTS CONSOLE RPR: RPR: NONREACTIVE

## 2016-07-25 LAB — OB RESULTS CONSOLE HEPATITIS B SURFACE ANTIGEN: HEP B S AG: NEGATIVE

## 2016-07-25 LAB — OB RESULTS CONSOLE RUBELLA ANTIBODY, IGM: RUBELLA: NON-IMMUNE/NOT IMMUNE

## 2016-10-30 ENCOUNTER — Encounter (HOSPITAL_COMMUNITY): Payer: Self-pay | Admitting: *Deleted

## 2016-10-31 ENCOUNTER — Encounter (HOSPITAL_COMMUNITY): Payer: Self-pay

## 2016-10-31 ENCOUNTER — Ambulatory Visit (HOSPITAL_COMMUNITY)
Admission: RE | Admit: 2016-10-31 | Discharge: 2016-10-31 | Disposition: A | Discharge status: Home / Self Care | Payer: 59 | Source: Ambulatory Visit | Attending: Obstetrics and Gynecology | Admitting: Obstetrics and Gynecology

## 2016-10-31 DIAGNOSIS — Z3A26 26 weeks gestation of pregnancy: Secondary | ICD-10-CM | POA: Diagnosis not present

## 2016-10-31 DIAGNOSIS — O132 Gestational [pregnancy-induced] hypertension without significant proteinuria, second trimester: Secondary | ICD-10-CM | POA: Insufficient documentation

## 2016-10-31 NOTE — Consult Note (Signed)
Maternal Fetal Medicine Consultation  Requesting Provider(s): Philip Aspen, DO  Reason for consultation: Gestational hypertension  HPI: Maureen Melton is a 36 yo G3P2002, EDD 02/05/2017 currently at 26w 1d seen for consultation due to recent diagnosis of gestational hypertension.  Maureen Melton reports a history of mild preeclampsia with both of her previous pregnancies - labor was induced at 37 weeks on both occasions.  She is currently on baby aspirin due to her history of preeclampsia.  Maureen Melton was recently noted to have blood pressures in the 150/90 range as an outpatient.  Baseline preeclampsia labs were normal.  She reports that she had a recent ultrasound for growth that was normal.  Maureen Melton was recently started on Labetalol 200 mg BID - but only takes the medications when her blood pressures are high (reports that she "bottomed" out otherwise).  She is without complaints.   The fetus is active.  She denies HA, visual changes or RUQ pain.  OB History: OB History    Gravida Para Term Preterm AB Living   3 2 2  0   2   SAB TAB Ectopic Multiple Live Births         0 2      PMH:  Past Medical History:  Diagnosis Date  . Abnormal Pap smear   . Pregnancy induced hypertension   . Preterm labor     PSH:  Past Surgical History:  Procedure Laterality Date  . LEEP     Meds:  Current Outpatient Prescriptions on File Prior to Encounter  Medication Sig Dispense Refill  . Prenatal Vit-Fe Fumarate-FA (PRENATAL MULTIVITAMIN) TABS Take 1 tablet by mouth daily.      No current facility-administered medications on file prior to encounter.    Labetalol 200 mg BID (prn for elevated blood pressures)  Allergies:  Allergies  Allergen Reactions  . Penicillins Rash  . Zithromax [Azithromycin] Rash   FH: , Family History  Problem Relation Age of Onset  . Diabetes Paternal Grandmother   . Diabetes Paternal Grandfather   . Heart disease Maternal Grandfather    Soc:   Social History   Social History  . Marital status: Married    Spouse name: N/A  . Number of children: N/A  . Years of education: N/A   Occupational History  . Not on file.   Social History Main Topics  . Smoking status: Never Smoker  . Smokeless tobacco: Never Used  . Alcohol use No  . Drug use: No  . Sexual activity: Yes   Other Topics Concern  . Not on file   Social History Narrative  . No narrative on file    Review of Systems: no vaginal bleeding or cramping/contractions, no LOF, no nausea/vomiting. All other systems reviewed and are negative.  PE:   Vitals:   10/31/16 1032  BP: 132/90  Pulse: 93    A/P: 1) Single IUP at 26w 1d  2) Gestational hypertension - Maureen Melton denies a history of chronic hypertension outside of pregnancy.  She had Gestational hypertension / mild preeclampsia in both of her previous pregnancies, but never required medications until this current pregnancy.  Baseline preeclampsia labs were normal.  A 24 hr urine protein collection has not yet been obtained.  We briefly reviewed the typical management of gestational hypertension and preeclampsia.  Recommendations: 1) Would recommend a baseline 24-hr urine protein collection 2) Rather than Labetalol 200 mg BID as needed for elevated blood pressures, would recommend decreasing the dose to  100 mg BID.  This may be titrated upwards as needed - would try to achieve a target blood pressure of ~ 140/90. 3) Weekly outpatient visits for blood pressure checks and preeclampsia labs 4) Start antenatal testing at ~ 28 weeks.  Would recommend weekly BPPs- may transition to 2x weekly NSTs with weekly AFIs beginning at 32 weeks 5) Serial ultrasounds for growth every 4 weeks 6) Would have a low threshold to administer a course of betamethasone or inpatient observation if blood pressures worsening / other signs or symptoms of preeclampsia. 7) Delivery by 37 weeks or earlier based on the clinical scenario.     Thank you for the opportunity to be a part of the care of Maureen Melton. Please contact our office if we can be of further assistance.   I spent approximately 30 minutes with this patient with over 50% of time spent in face-to-face counseling.  Alpha GulaPaul Delray Reza, MD Maternal Fetal Medicine

## 2016-12-26 ENCOUNTER — Other Ambulatory Visit (HOSPITAL_COMMUNITY): Payer: Self-pay

## 2017-01-02 LAB — OB RESULTS CONSOLE GBS: STREP GROUP B AG: POSITIVE

## 2017-01-03 ENCOUNTER — Other Ambulatory Visit: Payer: Self-pay | Admitting: Obstetrics

## 2017-01-06 ENCOUNTER — Encounter (HOSPITAL_COMMUNITY): Payer: Self-pay | Admitting: *Deleted

## 2017-01-06 ENCOUNTER — Telehealth (HOSPITAL_COMMUNITY): Payer: Self-pay | Admitting: *Deleted

## 2017-01-06 NOTE — Telephone Encounter (Signed)
Preadmission screen  

## 2017-01-07 ENCOUNTER — Encounter (HOSPITAL_COMMUNITY): Payer: Self-pay | Admitting: *Deleted

## 2017-01-07 ENCOUNTER — Observation Stay (HOSPITAL_COMMUNITY)
Admission: AD | Admit: 2017-01-07 | Discharge: 2017-01-08 | Disposition: A | Discharge status: Home / Self Care | Payer: 59 | Source: Ambulatory Visit | Attending: Obstetrics & Gynecology | Admitting: Obstetrics & Gynecology

## 2017-01-07 DIAGNOSIS — Z3A35 35 weeks gestation of pregnancy: Secondary | ICD-10-CM | POA: Diagnosis not present

## 2017-01-07 DIAGNOSIS — O288 Other abnormal findings on antenatal screening of mother: Secondary | ICD-10-CM

## 2017-01-07 DIAGNOSIS — O139 Gestational [pregnancy-induced] hypertension without significant proteinuria, unspecified trimester: Secondary | ICD-10-CM | POA: Diagnosis present

## 2017-01-07 DIAGNOSIS — O1493 Unspecified pre-eclampsia, third trimester: Secondary | ICD-10-CM | POA: Diagnosis not present

## 2017-01-07 LAB — COMPREHENSIVE METABOLIC PANEL
ALK PHOS: 122 U/L (ref 38–126)
ALT: 13 U/L — AB (ref 14–54)
AST: 24 U/L (ref 15–41)
Albumin: 3 g/dL — ABNORMAL LOW (ref 3.5–5.0)
Anion gap: 11 (ref 5–15)
BUN: 7 mg/dL (ref 6–20)
CALCIUM: 8.7 mg/dL — AB (ref 8.9–10.3)
CHLORIDE: 106 mmol/L (ref 101–111)
CO2: 19 mmol/L — AB (ref 22–32)
CREATININE: 0.5 mg/dL (ref 0.44–1.00)
GFR calc Af Amer: >60 mL/min (ref >60)
GFR calc non Af Amer: >60 mL/min (ref >60)
GLUCOSE: 69 mg/dL (ref 65–99)
Potassium: 3.4 mmol/L — ABNORMAL LOW (ref 3.5–5.1)
SODIUM: 136 mmol/L (ref 135–145)
Total Bilirubin: 0.5 mg/dL (ref 0.3–1.2)
Total Protein: 7.1 g/dL (ref 6.5–8.1)

## 2017-01-07 LAB — URINALYSIS, ROUTINE W REFLEX MICROSCOPIC
Bilirubin Urine: NEGATIVE
Glucose, UA: NEGATIVE mg/dL
HGB URINE DIPSTICK: NEGATIVE
Ketones, ur: 20 mg/dL — AB
Leukocytes, UA: NEGATIVE
Nitrite: NEGATIVE
PROTEIN: NEGATIVE mg/dL
SPECIFIC GRAVITY, URINE: 1.012 (ref 1.005–1.030)
pH: 6 (ref 5.0–8.0)

## 2017-01-07 LAB — TYPE AND SCREEN
ABO/RH(D): O POS
ANTIBODY SCREEN: NEGATIVE

## 2017-01-07 LAB — CBC
HEMATOCRIT: 31.1 % — AB (ref 36.0–46.0)
HEMOGLOBIN: 10.2 g/dL — AB (ref 12.0–15.0)
MCH: 29.1 pg (ref 26.0–34.0)
MCHC: 32.8 g/dL (ref 30.0–36.0)
MCV: 88.6 fL (ref 78.0–100.0)
Platelets: 271 10*3/uL (ref 150–400)
RBC: 3.51 MIL/uL — ABNORMAL LOW (ref 3.87–5.11)
RDW: 14.3 % (ref 11.5–15.5)
WBC: 11.3 10*3/uL — ABNORMAL HIGH (ref 4.0–10.5)

## 2017-01-07 LAB — PROTEIN / CREATININE RATIO, URINE
Creatinine, Urine: 91 mg/dL
PROTEIN CREATININE RATIO: 0.22 mg/mg{creat} — AB (ref 0.00–0.15)
Total Protein, Urine: 20 mg/dL

## 2017-01-07 LAB — LACTATE DEHYDROGENASE: LDH: 134 U/L (ref 98–192)

## 2017-01-07 LAB — URIC ACID: Uric Acid, Serum: 4.9 mg/dL (ref 2.3–6.6)

## 2017-01-07 MED ORDER — ZOLPIDEM TARTRATE 5 MG PO TABS: 5.0000 mg | ORAL_TABLET | Freq: Every evening | ORAL | Status: DC | PRN

## 2017-01-07 MED ORDER — LACTATED RINGERS IV SOLN
INTRAVENOUS | Status: DC
  Administered 2017-01-07 – 2017-01-08 (×2): via INTRAVENOUS

## 2017-01-07 MED ORDER — PRENATAL MULTIVITAMIN CH
1.0000 | ORAL_TABLET | Freq: Every day | ORAL | Status: DC
  Administered 2017-01-08: 1 via ORAL
  Filled 2017-01-07: qty 1

## 2017-01-07 MED ORDER — DOCUSATE SODIUM 100 MG PO CAPS
100.0000 mg | ORAL_CAPSULE | Freq: Every day | ORAL | Status: DC
  Administered 2017-01-08: 100 mg via ORAL
  Filled 2017-01-07: qty 1

## 2017-01-07 MED ORDER — CALCIUM CARBONATE ANTACID 500 MG PO CHEW: 2.0000 | CHEWABLE_TABLET | ORAL | Status: DC | PRN

## 2017-01-07 MED ORDER — ACETAMINOPHEN 325 MG PO TABS: 650.0000 mg | ORAL_TABLET | ORAL | Status: DC | PRN

## 2017-01-07 NOTE — H&P (Signed)
Anterpartum History and Physical  [name] is 36 year old G3P2002 at 35 weeks 6 days admitted for 23 hour observation as direct admission from office for prolonged monitoring and evaluation for preeclampsia.  Patient with known gestational hypertension, NST in office was not reactive and BPP by ultrasound by US was 6/8 with a lack of breathing seen.   Patient notes feeling ill today with nausea and having "a foggy" mind.  She denies HA, no RUQ pain, no scotomata.   Pregnancy complicated by Gestational HTN  PMHX  Past Medical History:  Diagnosis Date  . Abnormal Pap smear   . Anemia   . Pregnancy induced hypertension   . Preterm labor     PSURG HX  Past Surgical History:  Procedure Laterality Date  . LEEP      FAM HX  Family History  Problem Relation Age of Onset  . Diabetes Paternal Grandmother   . Diabetes Paternal Grandfather   . Heart disease Maternal Grandfather     SOC HX  Social History   Social History  . Marital status: Married    Spouse name: N/A  . Number of children: N/A  . Years of education: N/A   Occupational History  . Not on file.   Social History Main Topics  . Smoking status: Never Smoker  . Smokeless tobacco: Never Used  . Alcohol use No  . Drug use: No  . Sexual activity: Yes   Other Topics Concern  . Not on file   Social History Narrative  . No narrative on file    ROS  As per HPI   LABS CBC    Component Value Date/Time   WBC 11.3 (H) 01/07/2017 1703   RBC 3.51 (L) 01/07/2017 1703   HGB 10.2 (L) 01/07/2017 1703   HCT 31.1 (L) 01/07/2017 1703   PLT 271 01/07/2017 1703   MCV 88.6 01/07/2017 1703   MCH 29.1 01/07/2017 1703   MCHC 32.8 01/07/2017 1703   RDW 14.3 01/07/2017 1703     Gen WDWN  IN NAD ABD Gravid soft NT GU deferred EXT 2+ DTRs no edema  Assessment: 36 yo G3P2 SIUP at 6310w6d with gestational HTN with mild range BPs, Non-reactive NST and BPP 6/8 in office.   Plan: 23 hour observation in  antepartum Discussed case with MFM and recommendation is for repeat BPP in morning PIH labs drawn and are negative.  Continuous monitoring.   Maureen Melton STACIA

## 2017-01-08 ENCOUNTER — Observation Stay (HOSPITAL_COMMUNITY): Payer: 59

## 2017-01-08 ENCOUNTER — Ambulatory Visit (HOSPITAL_COMMUNITY)
Admit: 2017-01-08 | Discharge: 2017-01-08 | Disposition: A | Discharge status: Home / Self Care | Payer: 59 | Attending: Obstetrics & Gynecology | Admitting: Obstetrics & Gynecology

## 2017-01-08 DIAGNOSIS — O1493 Unspecified pre-eclampsia, third trimester: Secondary | ICD-10-CM | POA: Diagnosis not present

## 2017-01-08 NOTE — Progress Notes (Signed)
Discharged to home via w/c in stable condition.

## 2017-01-08 NOTE — Progress Notes (Signed)
Discharge instructions given to patient and she verbalized understanding of all instructions provided. Written copy of AVS given to patient. 

## 2017-01-08 NOTE — Consult Note (Signed)
Maternal Fetal Medicine Consultation  Requesting Provider(s): Essie HartWalda Pinn, MD  Reason for consultation: Gestational hypertension, BPP of 6/8  HPI: Maureen Melton Scialdone is a 36 yo G3P2002, EDD 02/05/2017 currently at 36w 0d seen for consultation due to gestational hypertension and a BPP of 6/8 with a non reactive NST as an outpatient.  Ms. Thad RangerReynolds reports a history of mild preeclampsia with both of her previous pregnancies - labor was induced at 37 weeks on both occasions.  She is currently on baby aspirin due to her history of preeclampsia.  Since admission, the fetal tracing has been reassuring / reactive.  The patient had one isolated elevated blood pressure of 140/93 - all other BPs have been normal.  Her admission preeclampsia labs included a urine protein/creat ratio were normal.  The patient is currently on Labetalol 100 mg BID. She is without complaints.   The fetus is active. She denies HA, visual changes or RUQ pain.    OB History: OB History    Gravida Para Term Preterm AB Living   3 2 2  0   2   SAB TAB Ectopic Multiple Live Births         0 2      PMH:  Past Medical History:  Diagnosis Date  . Abnormal Pap smear   . Anemia   . Pregnancy induced hypertension   . Preterm labor     PSH:  Past Surgical History:  Procedure Laterality Date  . LEEP     Meds:  Current Facility-Administered Medications on File Prior to Encounter  Medication Dose Route Frequency Provider Last Rate Last Dose  . acetaminophen (TYLENOL) tablet 650 mg  650 mg Oral Q4H PRN Essie HartPinn, Walda, MD      . calcium carbonate (TUMS - dosed in mg elemental calcium) chewable tablet 400 mg of elemental calcium  2 tablet Oral Q4H PRN Essie HartPinn, Walda, MD      . docusate sodium (COLACE) capsule 100 mg  100 mg Oral Daily Essie HartPinn, Walda, MD      . lactated ringers infusion   Intravenous Continuous Essie HartPinn, Walda, MD 100 mL/hr at 01/08/17 0239    . prenatal multivitamin tablet 1 tablet  1 tablet Oral Q1200 Essie HartPinn, Walda, MD       . zolpidem (AMBIEN) tablet 5 mg  5 mg Oral QHS PRN Essie HartPinn, Walda, MD       Current Outpatient Prescriptions on File Prior to Encounter  Medication Sig Dispense Refill  . calcium carbonate (TUMS - DOSED IN MG ELEMENTAL CALCIUM) 500 MG chewable tablet Chew 2 tablets by mouth daily as needed for indigestion or heartburn.    . IRON PO Take 2 tablets by mouth daily.    Marland Kitchen. labetalol (NORMODYNE) 100 MG tablet Take 100 mg by mouth 2 (two) times daily.    . ondansetron (ZOFRAN) 8 MG tablet Take 8 mg by mouth every 8 (eight) hours as needed.    . Prenatal Vit-Fe Fumarate-FA (PRENATAL MULTIVITAMIN) TABS Take 1 tablet by mouth daily.      Allergies:  Allergies  Allergen Reactions  . Penicillins Rash    Has patient had a PCN reaction causing immediate rash, facial/tongue/throat swelling, SOB or lightheadedness with hypotension: no Has patient had a PCN reaction causing severe rash involving mucus membranes or skin necrosis: no Has patient had a PCN reaction that required hospitalization: no Has patient had a PCN reaction occurring within the last 10 years: no If all of the above answers are "NO", then may  proceed with Cephalosporin use.   Marland Kitchen. Zithromax [Azithromycin] Rash   FH:  Family History  Problem Relation Age of Onset  . Diabetes Paternal Grandmother   . Diabetes Paternal Grandfather   . Heart disease Maternal Grandfather    Soc:  Social History   Social History  . Marital status: Married    Spouse name: N/A  . Number of children: N/A  . Years of education: N/A   Occupational History  . Not on file.   Social History Main Topics  . Smoking status: Never Smoker  . Smokeless tobacco: Never Used  . Alcohol use No  . Drug use: No  . Sexual activity: Yes   Other Topics Concern  . Not on file   Social History Narrative  . No narrative on file    Review of Systems: no vaginal bleeding or cramping/contractions, no LOF, no nausea/vomiting. All other systems reviewed and are  negative.   PE:  140/93, 136/84, 115/68, 133/83, 132/86   GEN: well-appearing female ABD: gravid, NT Ext: Trace edema.  DTR 1+ bilaterally, no clonus  Labs: CBC    Component Value Date/Time   WBC 11.3 (H) 01/07/2017 1703   RBC 3.51 (L) 01/07/2017 1703   HGB 10.2 (L) 01/07/2017 1703   HCT 31.1 (L) 01/07/2017 1703   PLT 271 01/07/2017 1703   MCV 88.6 01/07/2017 1703   MCH 29.1 01/07/2017 1703   MCHC 32.8 01/07/2017 1703   RDW 14.3 01/07/2017 1703   CMP     Component Value Date/Time   NA 136 01/07/2017 1703   K 3.4 (L) 01/07/2017 1703   CL 106 01/07/2017 1703   CO2 19 (L) 01/07/2017 1703   GLUCOSE 69 01/07/2017 1703   BUN 7 01/07/2017 1703   CREATININE 0.50 01/07/2017 1703   CALCIUM 8.7 (L) 01/07/2017 1703   PROT 7.1 01/07/2017 1703   ALBUMIN 3.0 (L) 01/07/2017 1703   AST 24 01/07/2017 1703   ALT 13 (L) 01/07/2017 1703   ALKPHOS 122 01/07/2017 1703   BILITOT 0.5 01/07/2017 1703   GFRNONAA >60 01/07/2017 1703   GFRAA >60 01/07/2017 1703   Ultrasound: Single IUP at 36w 0d Gestational hypertension Cephalic presentation BPP 6/8 (-2 for non-sustained fetal breathing activity) Normal amniotic fluid volume  A/P: 1) Single IUP at 36w 0d  2) Gestational hypertension - admission labs including urine protein/creatinine ratio were normal.  The patient had one isolated blood pressure of 140/93 both otherwise normal.  Since admission, the fetal tracing has been reactive / reassuring.  While the BPP today was 6/8, with a reactive NST (8/10) would feel comfortable discharging the patient home today with continued close outpatient follow up and planned induction of labor at 37 weeks.  Recommendations: 1) Continue antenatal testing as outpatient - would counsel on fetal kick counts 2) Delivery at 37 weeks due to gestational hypertension assuming blood pressures and testing remain reassuring  Thank you for the opportunity to be a part of the care of Maureen Melton Rodden. Please  contact our office if we can be of further assistance.   I spent approximately 30 minutes with this patient with over 50% of time spent in face-to-face counseling.  Alpha GulaPaul Algenis Ballin, MD Maternal Fetal Medicine

## 2017-01-08 NOTE — Progress Notes (Signed)
Pt has no complaints. NST reactive. B/P stable. Labs all wnl. Pt has seen MFM and can be sent home.  Will check BPP on Friday.

## 2017-01-12 ENCOUNTER — Encounter (HOSPITAL_COMMUNITY): Payer: Self-pay

## 2017-01-12 ENCOUNTER — Inpatient Hospital Stay (HOSPITAL_COMMUNITY): Payer: 59 | Admitting: Anesthesiology

## 2017-01-12 ENCOUNTER — Inpatient Hospital Stay (HOSPITAL_COMMUNITY)
Admission: AD | Admit: 2017-01-12 | Discharge: 2017-01-14 | DRG: 775 | Disposition: A | Discharge status: Home / Self Care | Payer: 59 | Source: Ambulatory Visit | Attending: Obstetrics and Gynecology | Admitting: Obstetrics and Gynecology

## 2017-01-12 DIAGNOSIS — O1414 Severe pre-eclampsia complicating childbirth: Principal | ICD-10-CM | POA: Diagnosis present

## 2017-01-12 DIAGNOSIS — O26893 Other specified pregnancy related conditions, third trimester: Secondary | ICD-10-CM | POA: Diagnosis present

## 2017-01-12 DIAGNOSIS — O1493 Unspecified pre-eclampsia, third trimester: Secondary | ICD-10-CM | POA: Diagnosis present

## 2017-01-12 DIAGNOSIS — Z3A36 36 weeks gestation of pregnancy: Secondary | ICD-10-CM | POA: Diagnosis not present

## 2017-01-12 DIAGNOSIS — O99824 Streptococcus B carrier state complicating childbirth: Secondary | ICD-10-CM | POA: Diagnosis present

## 2017-01-12 DIAGNOSIS — Z880 Allergy status to penicillin: Secondary | ICD-10-CM | POA: Diagnosis not present

## 2017-01-12 LAB — URINALYSIS, ROUTINE W REFLEX MICROSCOPIC
BACTERIA UA: NONE SEEN
Bilirubin Urine: NEGATIVE
Glucose, UA: NEGATIVE mg/dL
HGB URINE DIPSTICK: NEGATIVE
Ketones, ur: 5 mg/dL — AB
Leukocytes, UA: NEGATIVE
NITRITE: NEGATIVE
PROTEIN: 30 mg/dL — AB
Specific Gravity, Urine: 1.026 (ref 1.005–1.030)
pH: 5 (ref 5.0–8.0)

## 2017-01-12 LAB — CBC
HCT: 30.8 % — ABNORMAL LOW (ref 36.0–46.0)
HEMATOCRIT: 29.7 % — AB (ref 36.0–46.0)
Hemoglobin: 9.8 g/dL — ABNORMAL LOW (ref 12.0–15.0)
Hemoglobin: 9.9 g/dL — ABNORMAL LOW (ref 12.0–15.0)
MCH: 29.1 pg (ref 26.0–34.0)
MCH: 28.6 pg (ref 26.0–34.0)
MCHC: 33 g/dL (ref 30.0–36.0)
MCHC: 32.1 g/dL (ref 30.0–36.0)
MCV: 88.1 fL (ref 78.0–100.0)
MCV: 89 fL (ref 78.0–100.0)
PLATELETS: 337 10*3/uL (ref 150–400)
Platelets: 318 10*3/uL (ref 150–400)
RBC: 3.37 MIL/uL — ABNORMAL LOW (ref 3.87–5.11)
RBC: 3.46 MIL/uL — ABNORMAL LOW (ref 3.87–5.11)
RDW: 14.3 % (ref 11.5–15.5)
RDW: 14.5 % (ref 11.5–15.5)
WBC: 7.5 10*3/uL (ref 4.0–10.5)
WBC: 8 10*3/uL (ref 4.0–10.5)

## 2017-01-12 LAB — COMPREHENSIVE METABOLIC PANEL
ALBUMIN: 2.8 g/dL — AB (ref 3.5–5.0)
ALT: 53 U/L (ref 14–54)
AST: 102 U/L — AB (ref 15–41)
Alkaline Phosphatase: 209 U/L — ABNORMAL HIGH (ref 38–126)
Anion gap: 9 (ref 5–15)
BILIRUBIN TOTAL: 0.7 mg/dL (ref 0.3–1.2)
BUN: 8 mg/dL (ref 6–20)
CHLORIDE: 108 mmol/L (ref 101–111)
CO2: 18 mmol/L — ABNORMAL LOW (ref 22–32)
Calcium: 8.5 mg/dL — ABNORMAL LOW (ref 8.9–10.3)
Creatinine, Ser: 0.56 mg/dL (ref 0.44–1.00)
GFR calc Af Amer: >60 mL/min (ref >60)
GFR calc non Af Amer: >60 mL/min (ref >60)
GLUCOSE: 96 mg/dL (ref 65–99)
POTASSIUM: 3.3 mmol/L — AB (ref 3.5–5.1)
Sodium: 135 mmol/L (ref 135–145)
TOTAL PROTEIN: 6.8 g/dL (ref 6.5–8.1)

## 2017-01-12 LAB — PROTEIN / CREATININE RATIO, URINE
Creatinine, Urine: 202 mg/dL
PROTEIN CREATININE RATIO: 0.3 mg/mg{creat} — AB (ref 0.00–0.15)
TOTAL PROTEIN, URINE: 60 mg/dL

## 2017-01-12 LAB — URIC ACID: Uric Acid, Serum: 4.3 mg/dL (ref 2.3–6.6)

## 2017-01-12 LAB — TYPE AND SCREEN
ABO/RH(D): O POS
ANTIBODY SCREEN: NEGATIVE

## 2017-01-12 LAB — LACTATE DEHYDROGENASE: LDH: 165 U/L (ref 98–192)

## 2017-01-12 MED ORDER — OXYCODONE-ACETAMINOPHEN 5-325 MG PO TABS: 2.0000 | ORAL_TABLET | ORAL | Status: DC | PRN

## 2017-01-12 MED ORDER — PHENYLEPHRINE 40 MCG/ML (10ML) SYRINGE FOR IV PUSH (FOR BLOOD PRESSURE SUPPORT): 80.0000 ug | PREFILLED_SYRINGE | INTRAVENOUS | Status: DC | PRN

## 2017-01-12 MED ORDER — BUTORPHANOL TARTRATE 1 MG/ML IJ SOLN: 1.0000 mg | INTRAMUSCULAR | Status: DC | PRN

## 2017-01-12 MED ORDER — MISOPROSTOL 200 MCG PO TABS
1000.0000 ug | ORAL_TABLET | Freq: Once | ORAL | Status: AC
  Administered 2017-01-12: 1000 ug via ORAL

## 2017-01-12 MED ORDER — LACTATED RINGERS IV SOLN: INTRAVENOUS | Status: DC

## 2017-01-12 MED ORDER — ACETAMINOPHEN 325 MG PO TABS: 650.0000 mg | ORAL_TABLET | ORAL | Status: DC | PRN

## 2017-01-12 MED ORDER — EPHEDRINE 5 MG/ML INJ: 10.0000 mg | INTRAVENOUS | Status: DC | PRN

## 2017-01-12 MED ORDER — TERBUTALINE SULFATE 1 MG/ML IJ SOLN
0.2500 mg | Freq: Once | INTRAMUSCULAR | Status: DC | PRN
  Filled 2017-01-12: qty 1

## 2017-01-12 MED ORDER — ONDANSETRON HCL 4 MG/2ML IJ SOLN: 4.0000 mg | INTRAMUSCULAR | Status: DC | PRN

## 2017-01-12 MED ORDER — COCONUT OIL OIL: 1.0000 "application " | TOPICAL_OIL | Status: DC | PRN

## 2017-01-12 MED ORDER — PHENYLEPHRINE 40 MCG/ML (10ML) SYRINGE FOR IV PUSH (FOR BLOOD PRESSURE SUPPORT)
80.0000 ug | PREFILLED_SYRINGE | INTRAVENOUS | Status: DC | PRN
  Filled 2017-01-12: qty 10

## 2017-01-12 MED ORDER — OXYCODONE-ACETAMINOPHEN 5-325 MG PO TABS: 1.0000 | ORAL_TABLET | ORAL | Status: DC | PRN

## 2017-01-12 MED ORDER — DIPHENHYDRAMINE HCL 50 MG/ML IJ SOLN: 12.5000 mg | INTRAMUSCULAR | Status: DC | PRN

## 2017-01-12 MED ORDER — PRENATAL MULTIVITAMIN CH
1.0000 | ORAL_TABLET | Freq: Every day | ORAL | Status: DC
  Administered 2017-01-13: 1 via ORAL
  Filled 2017-01-12: qty 1

## 2017-01-12 MED ORDER — FENTANYL 2.5 MCG/ML BUPIVACAINE 1/10 % EPIDURAL INFUSION (WH - ANES)
14.0000 mL/h | INTRAMUSCULAR | Status: DC | PRN
  Administered 2017-01-12: 14 mL/h via EPIDURAL
  Administered 2017-01-12: 12 mL/h via EPIDURAL
  Filled 2017-01-12: qty 100

## 2017-01-12 MED ORDER — OXYTOCIN 40 UNITS IN LACTATED RINGERS INFUSION - SIMPLE MED
2.5000 [IU]/h | INTRAVENOUS | Status: DC
  Administered 2017-01-12: 2.5 [IU]/h via INTRAVENOUS
  Filled 2017-01-12: qty 1000

## 2017-01-12 MED ORDER — TETANUS-DIPHTH-ACELL PERTUSSIS 5-2.5-18.5 LF-MCG/0.5 IM SUSP
0.5000 mL | Freq: Once | INTRAMUSCULAR | Status: DC
  Filled 2017-01-12: qty 0.5

## 2017-01-12 MED ORDER — DIBUCAINE 1 % RE OINT: 1.0000 "application " | TOPICAL_OINTMENT | RECTAL | Status: DC | PRN

## 2017-01-12 MED ORDER — LIDOCAINE HCL (PF) 1 % IJ SOLN
INTRAMUSCULAR | Status: DC | PRN
  Administered 2017-01-12 (×2): 6 mL via EPIDURAL

## 2017-01-12 MED ORDER — TERBUTALINE SULFATE 1 MG/ML IJ SOLN: 0.2500 mg | Freq: Once | INTRAMUSCULAR | Status: DC | PRN

## 2017-01-12 MED ORDER — LABETALOL HCL 100 MG PO TABS
100.0000 mg | ORAL_TABLET | Freq: Two times a day (BID) | ORAL | Status: DC
  Administered 2017-01-12: 100 mg via ORAL
  Filled 2017-01-12 (×2): qty 1

## 2017-01-12 MED ORDER — MAGNESIUM SULFATE BOLUS VIA INFUSION
4.0000 g | Freq: Once | INTRAVENOUS | Status: AC
  Administered 2017-01-12: 4 g via INTRAVENOUS
  Filled 2017-01-12: qty 500

## 2017-01-12 MED ORDER — SOD CITRATE-CITRIC ACID 500-334 MG/5ML PO SOLN: 30.0000 mL | ORAL | Status: DC | PRN

## 2017-01-12 MED ORDER — IBUPROFEN 600 MG PO TABS
600.0000 mg | ORAL_TABLET | Freq: Four times a day (QID) | ORAL | Status: DC
  Administered 2017-01-13 – 2017-01-14 (×5): 600 mg via ORAL
  Filled 2017-01-12 (×4): qty 1

## 2017-01-12 MED ORDER — HYDROXYZINE HCL 50 MG PO TABS
25.0000 mg | ORAL_TABLET | Freq: Three times a day (TID) | ORAL | Status: DC | PRN
  Administered 2017-01-12: 25 mg via ORAL
  Filled 2017-01-12 (×2): qty 1

## 2017-01-12 MED ORDER — OXYTOCIN BOLUS FROM INFUSION
500.0000 mL | Freq: Once | INTRAVENOUS | Status: DC
  Administered 2017-01-12: 500 mL via INTRAVENOUS

## 2017-01-12 MED ORDER — LABETALOL HCL 200 MG PO TABS
100.0000 mg | ORAL_TABLET | Freq: Two times a day (BID) | ORAL | Status: DC
  Administered 2017-01-12: 100 mg via ORAL
  Filled 2017-01-12: qty 1

## 2017-01-12 MED ORDER — ACETAMINOPHEN 325 MG PO TABS
650.0000 mg | ORAL_TABLET | ORAL | Status: DC | PRN
  Administered 2017-01-12: 650 mg via ORAL
  Filled 2017-01-12: qty 2

## 2017-01-12 MED ORDER — SENNOSIDES-DOCUSATE SODIUM 8.6-50 MG PO TABS
2.0000 | ORAL_TABLET | ORAL | Status: DC
  Administered 2017-01-14: 2 via ORAL
  Filled 2017-01-12: qty 2

## 2017-01-12 MED ORDER — ONDANSETRON HCL 4 MG PO TABS: 4.0000 mg | ORAL_TABLET | ORAL | Status: DC | PRN

## 2017-01-12 MED ORDER — LIDOCAINE HCL (PF) 1 % IJ SOLN
30.0000 mL | INTRAMUSCULAR | Status: DC | PRN
  Filled 2017-01-12 (×2): qty 30

## 2017-01-12 MED ORDER — CEFAZOLIN SODIUM-DEXTROSE 2-4 GM/100ML-% IV SOLN
2.0000 g | Freq: Once | INTRAVENOUS | Status: DC
  Filled 2017-01-12: qty 100

## 2017-01-12 MED ORDER — LACTATED RINGERS IV SOLN: 500.0000 mL | Freq: Once | INTRAVENOUS | Status: DC

## 2017-01-12 MED ORDER — BENZOCAINE-MENTHOL 20-0.5 % EX AERO: 1.0000 "application " | INHALATION_SPRAY | CUTANEOUS | Status: DC | PRN

## 2017-01-12 MED ORDER — MAGNESIUM SULFATE 40 G IN LACTATED RINGERS - SIMPLE
2.0000 g/h | INTRAVENOUS | Status: AC
  Administered 2017-01-13: 2 g/h via INTRAVENOUS
  Filled 2017-01-12 (×2): qty 500

## 2017-01-12 MED ORDER — DIPHENHYDRAMINE HCL 25 MG PO CAPS: 25.0000 mg | ORAL_CAPSULE | Freq: Four times a day (QID) | ORAL | Status: DC | PRN

## 2017-01-12 MED ORDER — MISOPROSTOL 200 MCG PO TABS
ORAL_TABLET | ORAL | Status: AC
  Filled 2017-01-12: qty 5

## 2017-01-12 MED ORDER — WITCH HAZEL-GLYCERIN EX PADS: 1.0000 "application " | MEDICATED_PAD | CUTANEOUS | Status: DC | PRN

## 2017-01-12 MED ORDER — OXYTOCIN 40 UNITS IN LACTATED RINGERS INFUSION - SIMPLE MED: 1.0000 m[IU]/min | INTRAVENOUS | Status: DC

## 2017-01-12 MED ORDER — LACTATED RINGERS IV SOLN
500.0000 mL | INTRAVENOUS | Status: DC | PRN
  Administered 2017-01-12: 500 mL via INTRAVENOUS

## 2017-01-12 MED ORDER — CEFAZOLIN SODIUM-DEXTROSE 1-4 GM/50ML-% IV SOLN
1.0000 g | Freq: Three times a day (TID) | INTRAVENOUS | Status: DC
  Administered 2017-01-12: 1 g via INTRAVENOUS
  Filled 2017-01-12: qty 50

## 2017-01-12 MED ORDER — SIMETHICONE 80 MG PO CHEW: 80.0000 mg | CHEWABLE_TABLET | ORAL | Status: DC | PRN

## 2017-01-12 MED ORDER — LABETALOL HCL 100 MG PO TABS: 100.0000 mg | ORAL_TABLET | Freq: Two times a day (BID) | ORAL | Status: DC

## 2017-01-12 MED ORDER — ONDANSETRON HCL 4 MG/2ML IJ SOLN
4.0000 mg | Freq: Four times a day (QID) | INTRAMUSCULAR | Status: DC | PRN
  Administered 2017-01-12: 4 mg via INTRAVENOUS
  Filled 2017-01-12: qty 2

## 2017-01-12 NOTE — MAU Provider Note (Signed)
Chief Complaint:  Pruritis   First Provider Initiated Contact with Patient 01/12/17 [phone]      HPI: Maureen Melton is a 36 y.o. G3P2002 at 2278w4d who presents to maternity admissions reporting severe itching of her entire body, worse in her hands and feet x 3 days. She reports the itching started in her hands and feet late 3 nights ago but was mild and she was able to sleep. Then, 2 days ago she called the office to report the itching was worse but still only on her hands and feet.  She planned to come to the office Monday for labwork to evaluate.  But, today, the itching is more severe and everywhere, on her arms and legs, torso, and her head and neck. There is no visible rash anywhere. She has not tried any treatments, nothing makes it better or worse.  She takes labetalol 100 mg BID for hypertension but did not take her morning dose because she was nauseous but she took Zofran and is no longer nauseous in MAU. She reports good fetal movement, denies regular contractions, LOF, vaginal bleeding, vaginal itching/burning, urinary symptoms, h/a, dizziness, n/v, or fever/chills.    HPI  Past Medical History: Past Medical History:  Diagnosis Date  . Abnormal Pap smear   . Anemia   . Pregnancy induced hypertension   . Preterm labor     Past obstetric history: OB History  Gravida Para Term Preterm AB Living  3 2 2  0   2  SAB TAB Ectopic Multiple Live Births        0 2    # Outcome Date GA Lbr Len/2nd Weight Sex Delivery Anes PTL Lv  3 Current           2 Term 01/06/13 2856w0d 00:34 / 00:16 5 lb 8.8 oz (2.517 kg) F Vag-Spont EPI  LIV     Birth Comments: WNL  1 Term 2011 1356w0d 07:00 6 lb 11 oz (3.033 kg) F Vag-Spont EPI  LIV      Past Surgical History: Past Surgical History:  Procedure Laterality Date  . LEEP      Family History: Family History  Problem Relation Age of Onset  . Diabetes Paternal Grandmother   . Diabetes Paternal Grandfather   . Heart disease Maternal Grandfather      Social History: Social History  Substance Use Topics  . Smoking status: Never Smoker  . Smokeless tobacco: Never Used  . Alcohol use No    Allergies:  Allergies  Allergen Reactions  . Penicillins Rash    Has patient had a PCN reaction causing immediate rash, facial/tongue/throat swelling, SOB or lightheadedness with hypotension: no Has patient had a PCN reaction causing severe rash involving mucus membranes or skin necrosis: no Has patient had a PCN reaction that required hospitalization: no Has patient had a PCN reaction occurring within the last 10 years: no If all of the above answers are "NO", then may proceed with Cephalosporin use.   Marland Kitchen. Zithromax [Azithromycin] Rash    Meds:  Prescriptions Prior to Admission  Medication Sig Dispense Refill Last Dose  . IRON PO Take 2 tablets by mouth daily.   01/06/2017 at Unknown time  . labetalol (NORMODYNE) 100 MG tablet Take 100 mg by mouth 2 (two) times daily.   01/07/2017 at 1100  . ondansetron (ZOFRAN) 8 MG tablet Take 8 mg by mouth every 8 (eight) hours as needed.   01/07/2017 at Unknown time  . Prenatal Vit-Fe Fumarate-FA (PRENATAL MULTIVITAMIN)  TABS Take 1 tablet by mouth daily.    01/06/2017 at Unknown time    ROS:  Review of Systems  Constitutional: Negative for chills, fatigue and fever.  Respiratory: Negative for shortness of breath.   Cardiovascular: Negative for chest pain.  Gastrointestinal: Negative for abdominal pain.  Genitourinary: Negative for difficulty urinating, dysuria, flank pain, pelvic pain, vaginal bleeding, vaginal discharge and vaginal pain.  Neurological: Negative for dizziness and headaches.  Psychiatric/Behavioral: Negative.      I have reviewed patient's Past Medical Hx, Surgical Hx, Family Hx, Social Hx, medications and allergies.   Physical Exam   Patient Vitals for the past 24 hrs:  BP Temp Temp src Pulse Resp SpO2 Height Weight  01/12/17 1127 - - - - - - 5\' 4"  (1.626 m) 185 lb (83.9 kg)   01/12/17 1046 111/76 - - 88 - 99 % - -  01/12/17 1031 124/74 - - 85 - 98 % - -  01/12/17 1015 124/83 - - 84 - - - -  01/12/17 0945 (!) 147/97 - - (!) 101 - - - -  01/12/17 0930 (!) 145/94 - - 90 - - - -  01/12/17 0915 (!) 146/92 - - 97 - - - -  01/12/17 0855 (!) 149/93 97.9 F (36.6 C) Oral (!) 106 16 - - -  01/12/17 0845 - - - - - - 5\' 4"  (1.626 m) 185 lb (83.9 kg)   Constitutional: Well-developed, well-nourished female in no acute distress.  Cardiovascular: normal rate Respiratory: normal effort GI: Abd soft, non-tender, gravid appropriate for gestational age.  MS: Extremities nontender, no edema, normal ROM Neurologic: Alert and oriented x 4.  GU: Neg CVAT.    FHT:  Baseline 135 , moderate variability, accelerations present, ? Isolated variable and no other decelerations noted  Contractions: Rare, mild to palpation   Labs: Results for orders placed or performed during the hospital encounter of 01/12/17 (from the past 24 hour(s))  Urinalysis, Routine w reflex microscopic     Status: Abnormal   Collection Time: 01/12/17  8:45 AM  Result Value Ref Range   Color, Urine AMBER (A) YELLOW   APPearance HAZY (A) CLEAR   Specific Gravity, Urine 1.026 1.005 - 1.030   pH 5.0 5.0 - 8.0   Glucose, UA NEGATIVE NEGATIVE mg/dL   Hgb urine dipstick NEGATIVE NEGATIVE   Bilirubin Urine NEGATIVE NEGATIVE   Ketones, ur 5 (A) NEGATIVE mg/dL   Protein, ur 30 (A) NEGATIVE mg/dL   Nitrite NEGATIVE NEGATIVE   Leukocytes, UA NEGATIVE NEGATIVE   RBC / HPF 0-5 0 - 5 RBC/hpf   WBC, UA 0-5 0 - 5 WBC/hpf   Bacteria, UA NONE SEEN NONE SEEN   Squamous Epithelial / LPF 6-30 (A) NONE SEEN   Mucous PRESENT   Protein / creatinine ratio, urine     Status: Abnormal   Collection Time: 01/12/17  8:45 AM  Result Value Ref Range   Creatinine, Urine 202.00 mg/dL   Total Protein, Urine 60 mg/dL   Protein Creatinine Ratio 0.30 (H) 0.00 - 0.15 mg/mg[Cre]  CBC     Status: Abnormal   Collection Time: 01/12/17   9:58 AM  Result Value Ref Range   WBC 7.5 4.0 - 10.5 K/uL   RBC 3.37 (L) 3.87 - 5.11 MIL/uL   Hemoglobin 9.8 (L) 12.0 - 15.0 g/dL   HCT 29.529.7 (L) 62.136.0 - 30.846.0 %   MCV 88.1 78.0 - 100.0 fL   MCH 29.1 26.0 - 34.0 pg  MCHC 33.0 30.0 - 36.0 g/dL   RDW 95.2 84.1 - 32.4 %   Platelets 318 150 - 400 K/uL  Comprehensive metabolic panel     Status: Abnormal   Collection Time: 01/12/17  9:58 AM  Result Value Ref Range   Sodium 135 135 - 145 mmol/L   Potassium 3.3 (L) 3.5 - 5.1 mmol/L   Chloride 108 101 - 111 mmol/L   CO2 18 (L) 22 - 32 mmol/L   Glucose, Bld 96 65 - 99 mg/dL   BUN 8 6 - 20 mg/dL   Creatinine, Ser 4.01 0.44 - 1.00 mg/dL   Calcium 8.5 (L) 8.9 - 10.3 mg/dL   Total Protein 6.8 6.5 - 8.1 g/dL   Albumin 2.8 (L) 3.5 - 5.0 g/dL   AST 027 (H) 15 - 41 U/L   ALT 53 14 - 54 U/L   Alkaline Phosphatase 209 (H) 38 - 126 U/L   Total Bilirubin 0.7 0.3 - 1.2 mg/dL   GFR calc non Af Amer >60 >60 mL/min   GFR calc Af Amer >60 >60 mL/min   Anion gap 9 5 - 15  Uric acid     Status: None   Collection Time: 01/12/17  9:58 AM  Result Value Ref Range   Uric Acid, Serum 4.3 2.3 - 6.6 mg/dL  Lactate dehydrogenase     Status: None   Collection Time: 01/12/17  9:58 AM  Result Value Ref Range   LDH 165 98 - 192 U/L   --/--/O POS (07/31 1703)  Imaging:    MAU Course/MDM: I have ordered labs and reviewed results.  NST reviewed and reactive Labetalol 100 mg PO given in MAU and Vistaril while labs pending P/C ratio 0.3, higher than 5 days ago when seen in MAU and AST elevated to 102 Bile acids pending Consult Dr Tenny Craw with presentation, exam findings and test results.  Admit for IOL for preeclampsia with severe features with elevated liver enzymes, also likely cholestasis of pregnancy with bile acids pending Pt stable at time of transfer to [facility]  Assessment: 36 y.o. [id number] @[redacted]w[redacted]d  Preeclampsia with severe features Likely cholestasis of pregnancy with labs pending  Plan: Admit  to [facility] for IOL Dr Tenny Craw to assume care of pt  Maureen Melton Certified Nurse-Midwife 01/12/2017 11:43 AM

## 2017-01-12 NOTE — MAU Note (Signed)
Reports whole body itching since Saturday morning, stayed constant through the day and got worse last night. No rash visible. Tried cortisone cream with no relief. Called Dr Tenny Crawoss and discussed that it might be cholelithiasis and she would need blood work. No vag bleeding, and good fetal movement.  Has PIH, did not take labetelol this morning due to nausea.

## 2017-01-12 NOTE — H&P (Signed)
Maureen Melton is a 36 y.o. female presenting for systemic itching  36 yo G3P2002 @ 36+4 presents for c/o of new onset of systemic itching over the weekend. Yesterday she had nausea and vomiting with severe RUQ pain. After vomiting, the RUQ pain resolved. Within a few hours the patient experienced onset of itching on palms and soles. This has progressed to systemic itching. Her pregnancy has been complicated by onset of severe gestational hypertension around 24 weeks. She was placed on labetalol 100mg  bid. She is scheduled for IOL on Thursday for gestational hypertension. Bloodwork today shows elevated liver enzymes. Given recent RUQ pain with new onset elevation of liver enzymes will proceed with iol for severe pre-eclampsia/HELLP OB History    Gravida Para Term Preterm AB Living   3 2 2  0   2   SAB TAB Ectopic Multiple Live Births         0 2     Past Medical History:  Diagnosis Date  . Abnormal Pap smear   . Anemia   . Pregnancy induced hypertension   . Preterm labor    Past Surgical History:  Procedure Laterality Date  . LEEP     Family History: family history includes Diabetes in her paternal grandfather and paternal grandmother; Heart disease in her maternal grandfather. Social History:  reports that she has never smoked. She has never used smokeless tobacco. She reports that she does not drink alcohol or use drugs.     Maternal Diabetes: No Genetic Screening: Normal Maternal Ultrasounds/Referrals: Normal Fetal Ultrasounds or other Referrals:  None Maternal Substance Abuse:  No Significant Maternal Medications:  None Significant Maternal Lab Results:  None Other Comments:  None  ROS History   Blood pressure 111/76, pulse 88, temperature 97.9 F (36.6 C), temperature source Oral, resp. rate 16, height 5\' 4"  (1.626 m), weight 83.9 kg (185 lb), SpO2 99 %, unknown if currently breastfeeding. Exam Physical Exam  Prenatal labs: ABO, Rh: --/--/O POS (07/31  1703) Antibody: NEG (07/31 1703) Rubella: Nonimmune (02/15 0000) RPR: Nonreactive (02/15 0000)  HBsAg: Negative (02/15 0000)  HIV: Non-reactive (02/15 0000)  GBS: Positive (07/26 0000)   Assessment/Plan: 1) Admit 2) Ancef for + GBS, PCN allergic but only rash 3) Magnesium sulfate for seizure prophylaxis 4) Pitocin augmentation 5) Epidural on request   Zymarion Favorite H. 01/12/2017, 11:03 AM

## 2017-01-12 NOTE — Progress Notes (Signed)
Patient ID: Maureen Melton, female   DOB: 12/21/1980, 36 y.o.   MRN: [id number]  S: [place] O:  Vitals:   01/12/17 1230 01/12/17 1238 01/12/17 1248 01/12/17 1349  BP: 129/89 128/86 (!) 130/92 130/80  Pulse: 77 88 92 79  Resp:      Temp:      TempSrc:      SpO2:      Weight:      Height:       AOX#, NAD cvx 3/70/-2, ant toco Q 2-4 FHR 130 reactive, cat 1 tracing  AROM clear fluid  A/P 1) FWB reassuring 2) Cont pit 2) Cont Ancef 4) Proceed with Epidural

## 2017-01-12 NOTE — Anesthesia Procedure Notes (Signed)
Epidural  Start time: 01/12/2017 4:15 PM End time: 01/12/2017 4:34 PM  Staffing Anesthesiologist: Jairo BenJACKSON, Willoughby Doell Performed: anesthesiologist   Preanesthetic Checklist Completed: patient identified, surgical consent, pre-op evaluation, timeout performed, IV checked, risks and benefits discussed and monitors and equipment checked  Epidural Patient position: sitting Prep: site prepped and draped and DuraPrep Patient monitoring: blood pressure, continuous pulse ox and heart rate Approach: midline Location: L3-L4 Injection technique: LOR air  Needle:  Needle type: Tuohy  Needle gauge: 17 G Needle length: 9 cm Needle insertion depth: 4.5 cm Catheter type: closed end flexible Catheter size: 19 Gauge Catheter at skin depth: 11 cm Test dose: negative (1% lidocaine)  Assessment Events: blood not aspirated, injection not painful, no injection resistance, negative IV test and no paresthesia  Additional Notes Pt identified in Labor room.  Monitors applied. Working IV access confirmed. Sterile prep, drape lumbar spine.  1% lido local L 3,4.  #17ga Touhy LOR air at 4.5 cm L 3,4, cath in easily to 11 cm skin. Test dose OK, cath dosed and infusion begun.  Patient asymptomatic, VSS, no heme aspirated, tolerated well.  Sandford Craze Matilde Pottenger, MDReason for block:procedure for pain

## 2017-01-12 NOTE — Anesthesia Pain Management Evaluation Note (Signed)
  CRNA Pain Management Visit Note  Patient: Maureen Melton, 36 y.o., female  "Hello I am a [facility] of the anesthesia [facility]. We have an anesthesia team available at all times to provide care throughout the hospital, including epidural management and anesthesia for C-section. I don't know your plan for the delivery whether it a natural birth, water birth, IV sedation, nitrous supplementation, doula or epidural, but we want to meet your pain goals."   1.Was your pain managed to your expectations on prior hospitalizations?   Yes   2.What is your expectation for pain management during this hospitalization?     Epidural  3.How can we help you reach that goal? Would like epidural at a later time. Dil to 2 at time of visit  Record the patient's initial score and the patient's pain goal.   Pain: 1  Pain Goal: 9 [facility] wants you to be able to say your pain was always managed very well.  Rica RecordsICKELTON,Jillana Selph 01/12/2017

## 2017-01-12 NOTE — Anesthesia Preprocedure Evaluation (Signed)
Anesthesia Evaluation  Patient identified by MRN, date of birth, ID band Patient awake    Reviewed: Allergy & Precautions, NPO status , Patient's Chart, lab work & pertinent test results  History of Anesthesia Complications Negative for: history of anesthetic complications  Airway Mallampati: II  TM Distance: >3 FB Neck ROM: Full    Dental  (+) Dental Advisory Given   Pulmonary neg pulmonary ROS,    breath sounds clear to auscultation       Cardiovascular hypertension (pregnancy induced), Pt. on medications  Rhythm:Regular Rate:Normal     Neuro/Psych negative neurological ROS     GI/Hepatic GERD  ,Cholestasis   Endo/Other  negative endocrine ROS  Renal/GU negative Renal ROS     Musculoskeletal   Abdominal   Peds  Hematology plt 337K   Anesthesia Other Findings   Reproductive/Obstetrics (+) Pregnancy                             Anesthesia Physical Anesthesia Plan  ASA: III  Anesthesia Plan: Epidural   Post-op Pain Management:    Induction:   PONV Risk Score and Plan: 2 and Treatment may vary due to age or medical condition  Airway Management Planned: Natural Airway  Additional Equipment:   Intra-op Plan:   Post-operative Plan:   Informed Consent: I have reviewed the patients History and Physical, chart, labs and discussed the procedure including the risks, benefits and alternatives for the proposed anesthesia with the patient or authorized representative who has indicated his/her understanding and acceptance.   Dental advisory given  Plan Discussed with:   Anesthesia Plan Comments: (Patient identified. Risks/Benefits/Options discussed with patient including but not limited to bleeding, infection, nerve damage, paralysis, failed block, incomplete pain control, headache, blood pressure changes, nausea, vomiting, reactions to medication both or allergic, itching and  postpartum back pain. Confirmed with bedside nurse the patient's most recent platelet count. Confirmed with patient that they are not currently taking any anticoagulation, have any bleeding history or any family history of bleeding disorders. Patient expressed understanding and wished to proceed. All questions were answered. )        Anesthesia Quick Evaluation

## 2017-01-13 ENCOUNTER — Encounter (HOSPITAL_COMMUNITY): Payer: Self-pay

## 2017-01-13 LAB — COMPREHENSIVE METABOLIC PANEL
ALT: 34 U/L (ref 14–54)
AST: 51 U/L — AB (ref 15–41)
Albumin: 2.5 g/dL — ABNORMAL LOW (ref 3.5–5.0)
Alkaline Phosphatase: 175 U/L — ABNORMAL HIGH (ref 38–126)
Anion gap: 9 (ref 5–15)
CHLORIDE: 102 mmol/L (ref 101–111)
CO2: 23 mmol/L (ref 22–32)
CREATININE: 0.84 mg/dL (ref 0.44–1.00)
Calcium: 6.6 mg/dL — ABNORMAL LOW (ref 8.9–10.3)
GFR calc Af Amer: >60 mL/min (ref >60)
Glucose, Bld: 109 mg/dL — ABNORMAL HIGH (ref 65–99)
Potassium: 3.5 mmol/L (ref 3.5–5.1)
SODIUM: 134 mmol/L — AB (ref 135–145)
Total Bilirubin: 0.4 mg/dL (ref 0.3–1.2)
Total Protein: 6.2 g/dL — ABNORMAL LOW (ref 6.5–8.1)

## 2017-01-13 LAB — CBC
HCT: 29.2 % — ABNORMAL LOW (ref 36.0–46.0)
HCT: 29.9 % — ABNORMAL LOW (ref 36.0–46.0)
HEMOGLOBIN: 9.6 g/dL — AB (ref 12.0–15.0)
Hemoglobin: 9.7 g/dL — ABNORMAL LOW (ref 12.0–15.0)
MCH: 28.8 pg (ref 26.0–34.0)
MCH: 28.8 pg (ref 26.0–34.0)
MCHC: 32.4 g/dL (ref 30.0–36.0)
MCHC: 32.9 g/dL (ref 30.0–36.0)
MCV: 87.7 fL (ref 78.0–100.0)
MCV: 88.7 fL (ref 78.0–100.0)
PLATELETS: 333 10*3/uL (ref 150–400)
Platelets: 313 10*3/uL (ref 150–400)
RBC: 3.33 MIL/uL — ABNORMAL LOW (ref 3.87–5.11)
RBC: 3.37 MIL/uL — AB (ref 3.87–5.11)
RDW: 14.4 % (ref 11.5–15.5)
RDW: 14.4 % (ref 11.5–15.5)
WBC: 12 10*3/uL — AB (ref 4.0–10.5)
WBC: 15.3 10*3/uL — AB (ref 4.0–10.5)

## 2017-01-13 LAB — BILE ACIDS, TOTAL: BILE ACIDS TOTAL: 39.9 umol/L — AB (ref 4.7–24.5)

## 2017-01-13 LAB — RPR: RPR: NONREACTIVE

## 2017-01-13 MED ORDER — LACTATED RINGERS IV SOLN
INTRAVENOUS | Status: DC
  Administered 2017-01-13: 03:00:00 via INTRAVENOUS

## 2017-01-13 NOTE — Progress Notes (Signed)
Patient is doing well.  She is ambulating, voiding, tolerating PO.  Pain control is good.  Lochia is appropriate. C/o feeling hot / flushed w magnesium.  Denies HA/RUQ pain / BV / SOB / CP.  Reports that baby is sleepy and not interested in breastfeeding, also w low temp this AM  Vitals:   01/13/17 0300 01/13/17 0400 01/13/17 0500 01/13/17 0601  BP:    115/69  Pulse:    83  Resp: 18 18 18 18   Temp:    98.5 F (36.9 C)  TempSrc:    Oral  SpO2:    98%  Weight:      Height:      Uop: 2300 cc/ 8 hrs  NAD Fundus firm Ext: 2+ edema b/l  Lab Results  Component Value Date   WBC 12.0 (H) 01/13/2017   HGB 9.6 (L) 01/13/2017   HCT 29.2 (L) 01/13/2017   MCV 87.7 01/13/2017   PLT 313 01/13/2017    --/--/O POS (08/05 1152)/RNI  A/P 36 y.o. [id number] PPD#1 s/p SVD at 36 weeks for severe preeclampsia by symptoms / labs . BPs wnl since delivery.  Is on labetalol 100mg  BID--may be able to stop meds--will monitor BPs this AM Will repeat CMP Cont magnesium for 24hr s/p delivery for seizure ppx.  Desires circ--baby not yet ready    Union HospitalDYANNA GEFFEL [employer]CLARK

## 2017-01-13 NOTE — Lactation Note (Signed)
This note was copied from a baby's chart. Lactation Consultation Note  Patient Name: Maureen Melton Reason for consult: Initial assessment;Late-preterm 34-36.6wks  LPI 13 hours old. Mom reports that baby is latching fine and she declines offer for assistance with latching the baby. However, mom reports that baby has been sleepy. Discussed infant behavior--both newborn and LPI. Reviewed LPI guidelines and enc putting baby to breast with cues, and at least every 3 hours. Enc mom to supplement with EBM/formula after each feeding, and to post-pump followed by hand expression. Discussed the need to pump in relation to supply and demand and protecting mom's breast milk supply. Enc mom to call for assistance as needed. Discussed assessment and interventions with Eunice Blaseebbie, RN.   Maternal Data Has patient been taught Hand Expression?: Yes (Per mom.) Does the patient have breastfeeding experience prior to this delivery?: Yes  Feeding Feeding Type: Formula Nipple Type: Slow - flow  LATCH Score                   Interventions    Lactation Tools Discussed/Used Pump Review: Setup, frequency, and cleaning;Milk Storage Initiated by:: bedside RN. Date initiated:: 01/14/17   Consult Status Consult Status: Follow-up Date: 01/14/17 Follow-up type: In-patient    Sherlyn HayJennifer D Sarina Robleto Melton, 12:11 PM

## 2017-01-13 NOTE — Anesthesia Postprocedure Evaluation (Signed)
Anesthesia Post Note  Patient: Maureen Melton  Procedure(s) Performed: * No procedures listed *     Patient location during evaluation: Mother Baby Anesthesia Type: Epidural Level of consciousness: awake and alert and oriented Pain management: satisfactory to patient Vital Signs Assessment: post-procedure vital signs reviewed and stable Respiratory status: spontaneous breathing and nonlabored ventilation Cardiovascular status: stable Postop Assessment: no headache, no backache, no signs of nausea or vomiting, adequate PO intake and patient able to bend at knees (patient up walking) Anesthetic complications: no    Last Vitals:  Vitals:   01/13/17 0500 01/13/17 0601  BP:  115/69  Pulse:  83  Resp: 18 18  Temp:  36.9 C    Last Pain:  Vitals:   01/13/17 0601  TempSrc: Oral  PainSc:    Pain Goal:                 Madison HickmanGREGORY,Neha Waight

## 2017-01-14 LAB — BIRTH TISSUE RECOVERY COLLECTION (PLACENTA DONATION)

## 2017-01-14 NOTE — Discharge Summary (Signed)
Obstetric Discharge Summary Reason for Admission: induction of labor Prenatal Procedures: NST and ultrasound Intrapartum Procedures: spontaneous vaginal delivery Postpartum Procedures: none Complications-Operative and Postpartum: none Hemoglobin  Date Value Ref Range Status  01/13/2017 9.6 (L) 12.0 - 15.0 g/dL Final   HCT  Date Value Ref Range Status  01/13/2017 29.2 (L) 36.0 - 46.0 % Final    Physical Exam:  General: alert and cooperative Lochia: appropriate Uterine Fundus: firm   Discharge Diagnoses: Preelampsia  Discharge Information: Date: 01/14/2017 Activity: pelvic rest Diet: routine Medications: PNV, Ibuprofen and Iron Condition: stable Instructions: refer to practice specific booklet Discharge to: home Follow-up Information    Maureen Melton, Kendra, Maureen Melton. Schedule an appointment as soon as possible for a visit in 1 week(s).   Specialty:  Obstetrics and Gynecology Contact information: [address] [place] [id number] [phone]           Newborn Data: Live born female  Birth Weight: 6 lb 8.2 oz (2955 g) APGAR: 9, 9  Home with mother.  Maureen Melton 01/14/2017, 8:37 AM

## 2017-01-14 NOTE — Progress Notes (Signed)
PPD#2 Pt states that she feels fantastic. She would like to go home. VSSAF Lochia wnl IMP/Stable Plan/Discharge to home.

## 2017-01-17 ENCOUNTER — Inpatient Hospital Stay (HOSPITAL_COMMUNITY): Admission: RE | Admit: 2017-01-17 | Payer: 59 | Source: Ambulatory Visit

## 2018-03-10 IMAGING — US US MFM FETAL BPP W/O NON-STRESS
1 series · 15 of 28 positions shown · non-contrast
Comparison: none

[Series 1: us mfm fetal bpp w/o non-stress · 31 acquisitions, 15 frames shown]
[im 1/31]
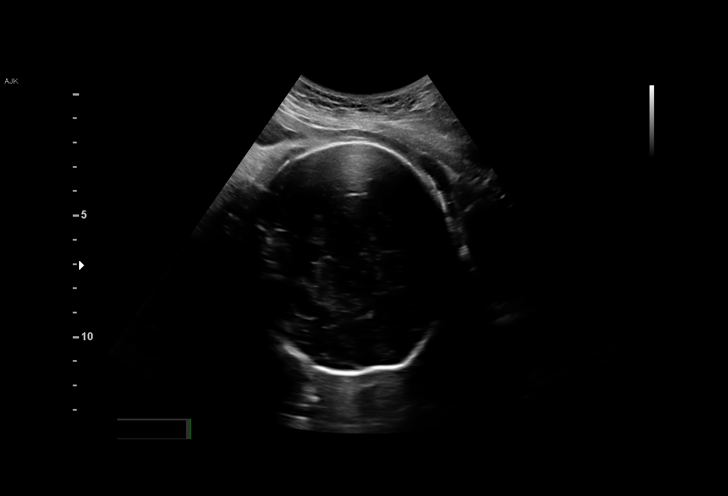
[im 3/31]
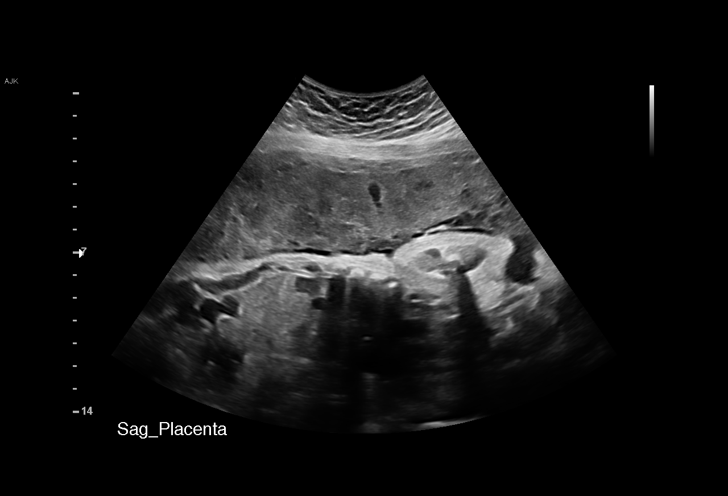
[im 5/31]
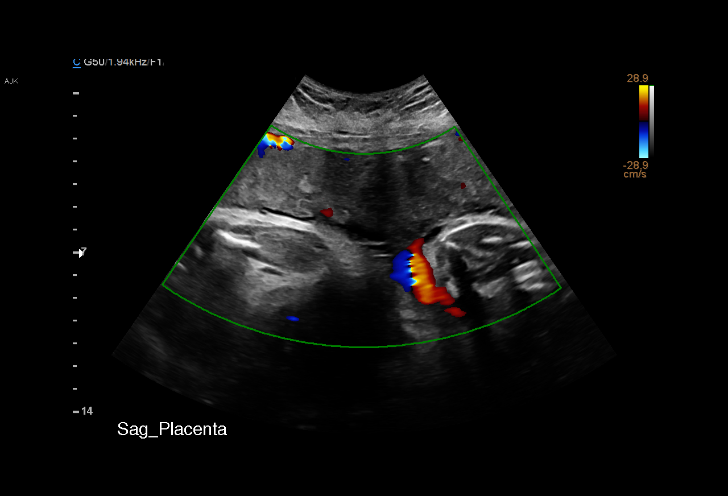
[im 7/31]
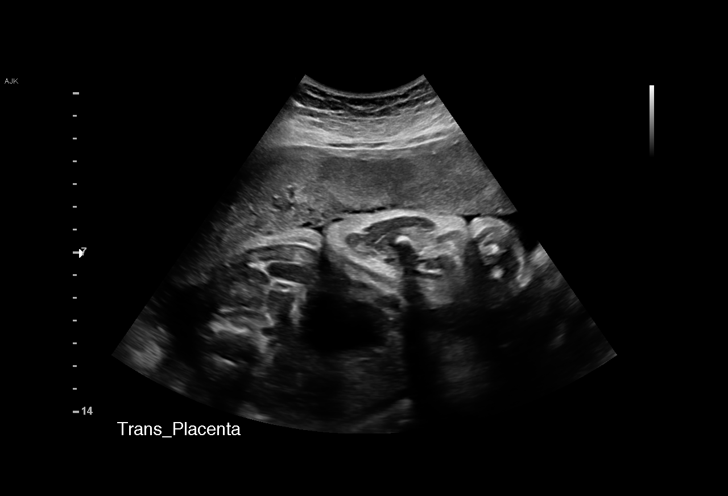
[im 9/31]
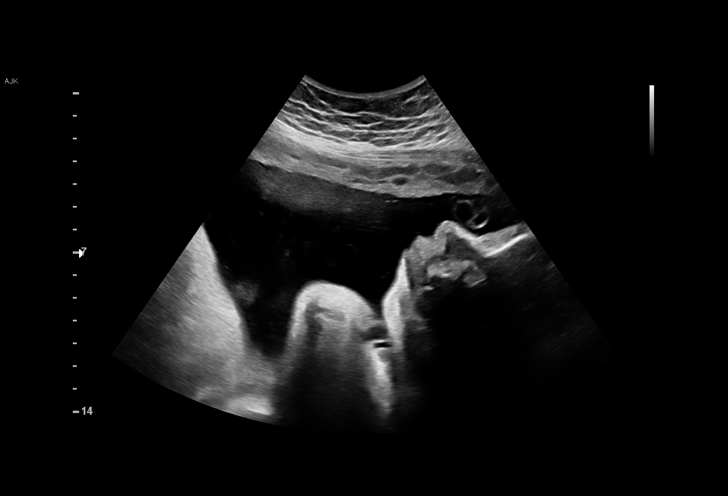
[im 12/31]
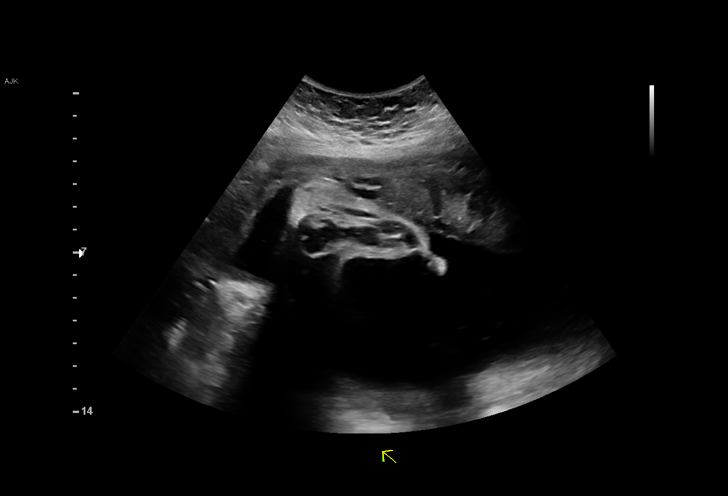
[im 14/31]
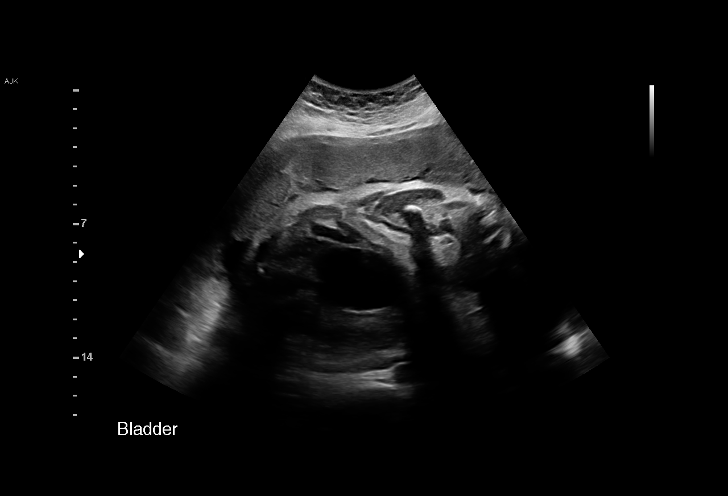
[im 16/31]
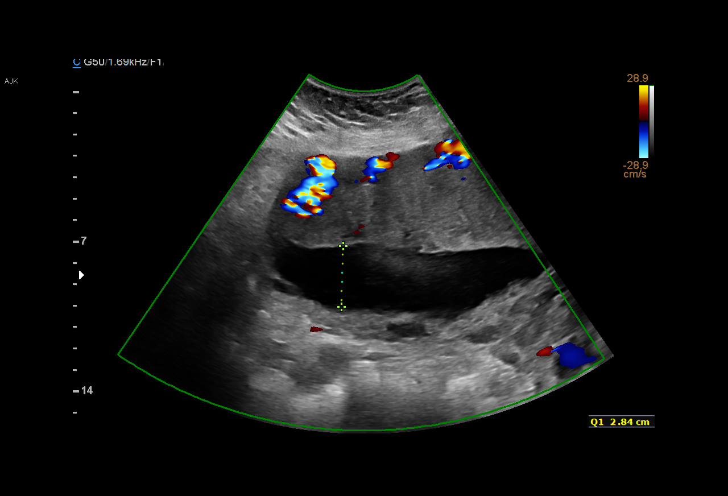
[im 17/31]
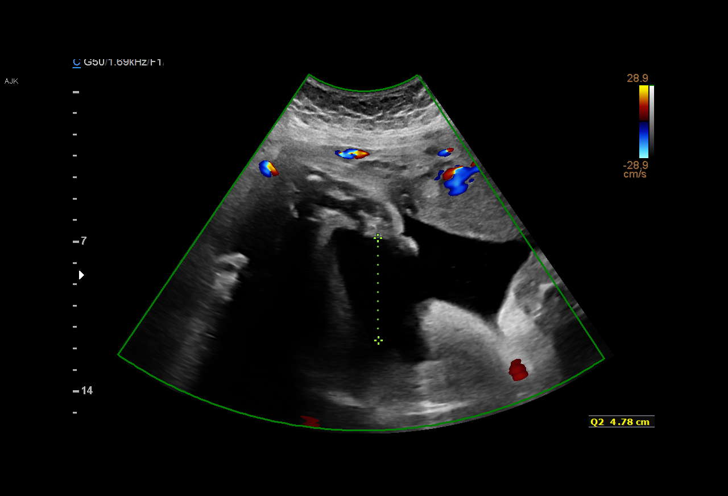
[im 19/31]
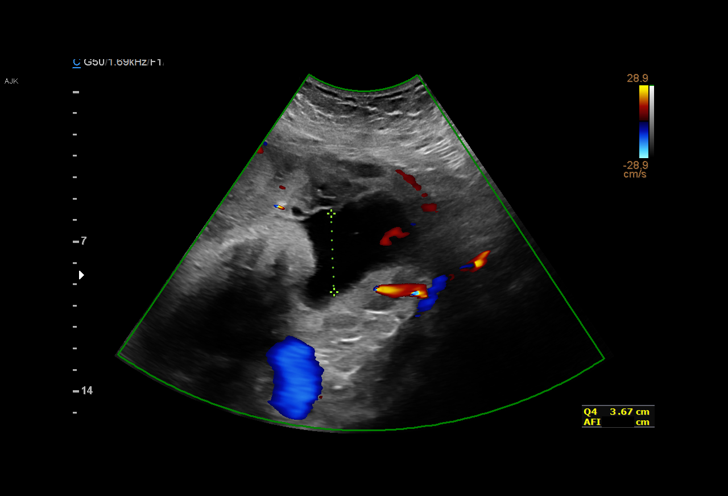
[im 22/31]
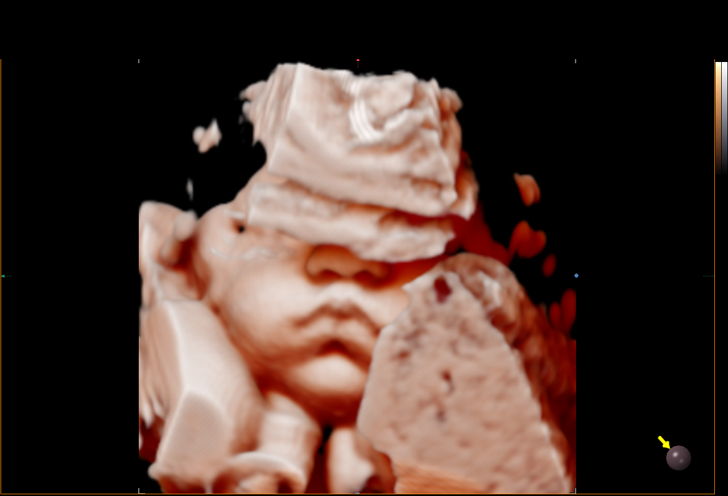
[im 24/31]
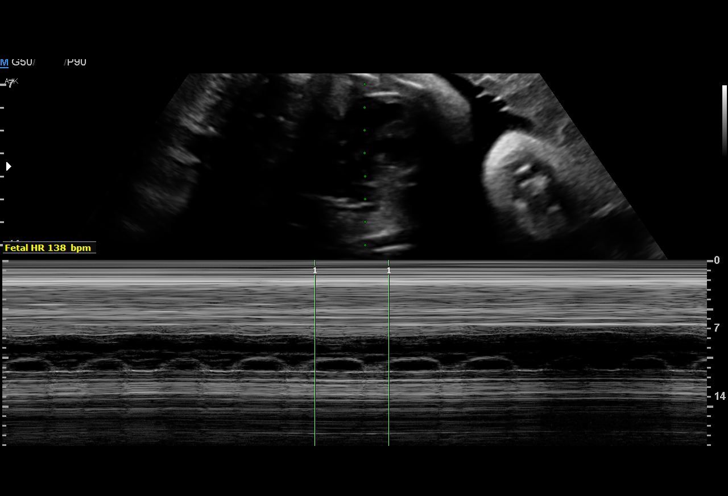
[im 26/31]
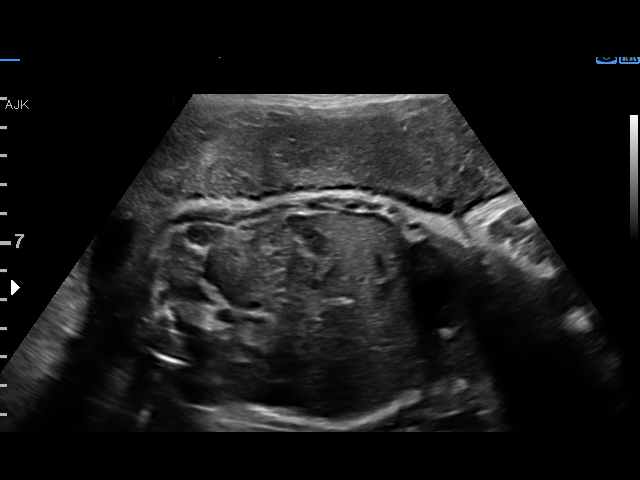
[im 28/31]
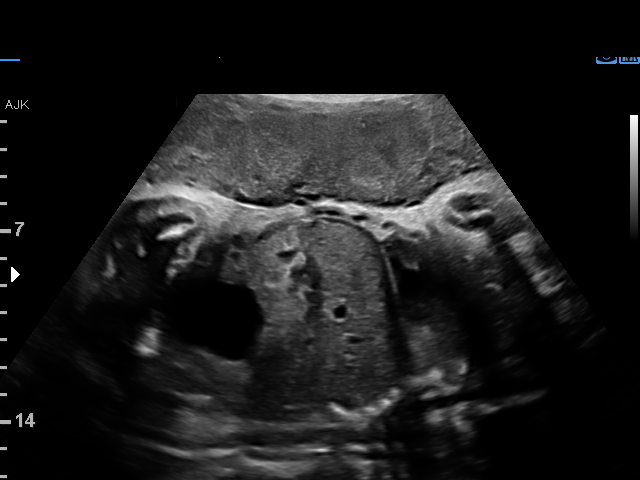
[im 31/31]
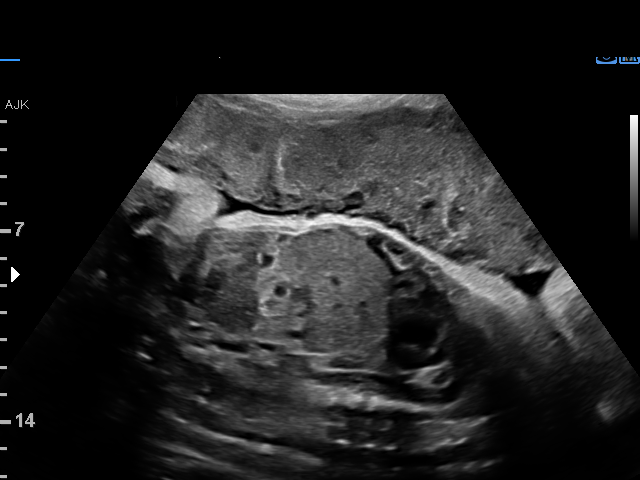

[15 of 28 positions shown; findings below may reference images not displayed]

[id number]

1  POPE PINDAR               [id number]      [id number]     [id number]
Indications

36 weeks gestation of pregnancy
Non-reactive NST
Hypertension - Gestational
OB History

Gravidity:    3         Term:   2        Prem:   0        SAB:   0
TOP:          0       Ectopic:  0        Living: 2
Fetal Evaluation

Num Of Fetuses:     1
Fetal Heart         138
Rate(bpm):
Cardiac Activity:   Observed
Presentation:       Cephalic
Placenta:           Anterior, above cervical os
P. Cord Insertion:  Visualized, central

Amniotic Fluid
AFI FV:      Subjectively within normal limits

AFI Sum(cm)     %Tile       Largest Pocket(cm)
16.23           60

RUQ(cm)       RLQ(cm)       LUQ(cm)        LLQ(cm)
2.84

Comment:    Breathing noted intermittently, but not sustained.
Biophysical Evaluation

Amniotic F.V:   Within normal limits       F. Tone:        Observed
F. Movement:    Observed                   Score:          [DATE]
F. Breathing:   Not Observed
Gestational Age

Best:          36w 0d    Det. By:   Early Ultrasound         EDD:   02/05/17
(07/05/16)
Impression

Single IUP at 36w 0d
Gestational hypertension
Cephalic presentation
BPP [DATE] (-2 for non-sustained fetal breathing activity)
Normal amniotic fluid volume
Recommendations

See separate consult note
Would coorelate BPP with NST - if reactive, would be
conformable discharging home with plans for induction of
labor at 37 weeks
